# Patient Record
Sex: Male | Born: 1985 | Race: White | Hispanic: No | Marital: Married | Smoking: Never smoker
Health system: Southern US, Community
[De-identification: ages and names within clinical notes are randomized; demographics above are authoritative.]

## PROBLEM LIST (undated history)

## (undated) DIAGNOSIS — T7840XA Allergy, unspecified, initial encounter: Secondary | ICD-10-CM

## (undated) DIAGNOSIS — F419 Anxiety disorder, unspecified: Secondary | ICD-10-CM

## (undated) DIAGNOSIS — F509 Eating disorder, unspecified: Secondary | ICD-10-CM

## (undated) HISTORY — PX: HERNIA REPAIR: SHX51

## (undated) HISTORY — PX: ANTERIOR CRUCIATE LIGAMENT REPAIR: SHX115

## (undated) HISTORY — DX: Anxiety disorder, unspecified: F41.9

## (undated) HISTORY — PX: NASAL SEPTUM SURGERY: SHX37

## (undated) HISTORY — DX: Eating disorder, unspecified: F50.9

## (undated) HISTORY — DX: Allergy, unspecified, initial encounter: T78.40XA

---

## 2011-07-18 ENCOUNTER — Other Ambulatory Visit: Payer: Self-pay | Admitting: Occupational Medicine

## 2011-07-18 ENCOUNTER — Ambulatory Visit
Admission: RE | Admit: 2011-07-18 | Discharge: 2011-07-18 | Disposition: A | Discharge status: Home / Self Care | Payer: No Typology Code available for payment source | Source: Ambulatory Visit | Attending: Occupational Medicine | Admitting: Occupational Medicine

## 2011-07-18 DIAGNOSIS — Z021 Encounter for pre-employment examination: Secondary | ICD-10-CM

## 2012-10-12 ENCOUNTER — Ambulatory Visit (INDEPENDENT_AMBULATORY_CARE_PROVIDER_SITE_OTHER): Payer: 59 | Admitting: Licensed Clinical Social Worker

## 2012-10-12 DIAGNOSIS — F4322 Adjustment disorder with anxiety: Secondary | ICD-10-CM

## 2012-10-22 ENCOUNTER — Ambulatory Visit (INDEPENDENT_AMBULATORY_CARE_PROVIDER_SITE_OTHER): Payer: 59 | Admitting: Licensed Clinical Social Worker

## 2012-10-22 DIAGNOSIS — F4323 Adjustment disorder with mixed anxiety and depressed mood: Secondary | ICD-10-CM

## 2012-11-12 ENCOUNTER — Ambulatory Visit: Payer: Self-pay | Admitting: Licensed Clinical Social Worker

## 2012-11-26 ENCOUNTER — Ambulatory Visit: Payer: 59 | Admitting: Licensed Clinical Social Worker

## 2014-04-29 ENCOUNTER — Ambulatory Visit (INDEPENDENT_AMBULATORY_CARE_PROVIDER_SITE_OTHER): Payer: 59 | Admitting: Family Medicine

## 2014-04-29 VITALS — BP 110/64 | HR 85 | Temp 97.9°F | Resp 18 | Ht 70.0 in | Wt 176.0 lb

## 2014-04-29 DIAGNOSIS — R632 Polyphagia: Secondary | ICD-10-CM

## 2014-04-29 DIAGNOSIS — F502 Bulimia nervosa: Secondary | ICD-10-CM | POA: Diagnosis not present

## 2014-04-29 DIAGNOSIS — F509 Eating disorder, unspecified: Secondary | ICD-10-CM

## 2014-04-29 DIAGNOSIS — F430 Acute stress reaction: Secondary | ICD-10-CM | POA: Diagnosis not present

## 2014-04-29 LAB — POCT CBC
GRANULOCYTE PERCENT: 62.9 % (ref 37–80)
HEMATOCRIT: 47.7 % (ref 43.5–53.7)
HEMOGLOBIN: 16.3 g/dL (ref 14.1–18.1)
LYMPH, POC: 2.4 (ref 0.6–3.4)
MCH, POC: 30.2 pg (ref 27–31.2)
MCHC: 34.2 g/dL (ref 31.8–35.4)
MCV: 88.2 fL (ref 80–97)
MID (cbc): 0.7 (ref 0–0.9)
MPV: 7.9 fL (ref 0–99.8)
POC GRANULOCYTE: 5.2 (ref 2–6.9)
POC LYMPH PERCENT: 29.2 %L (ref 10–50)
POC MID %: 7.9 %M (ref 0–12)
Platelet Count, POC: 266 10*3/uL (ref 142–424)
RBC: 5.41 M/uL (ref 4.69–6.13)
RDW, POC: 12.7 %
WBC: 8.3 10*3/uL (ref 4.6–10.2)

## 2014-04-29 MED ORDER — ESCITALOPRAM OXALATE 10 MG PO TABS: 10.0000 mg | ORAL_TABLET | Freq: Every day | ORAL | Status: DC
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
## 2014-04-30 LAB — COMPLETE METABOLIC PANEL WITH GFR
ALT: 9 U/L (ref 0–53)
AST: 14 U/L (ref 0–37)
Albumin: 4.5 g/dL (ref 3.5–5.2)
Alkaline Phosphatase: 58 U/L (ref 39–117)
BILIRUBIN TOTAL: 1.7 mg/dL — AB (ref 0.2–1.2)
BUN: 15 mg/dL (ref 6–23)
CALCIUM: 9.7 mg/dL (ref 8.4–10.5)
CHLORIDE: 96 meq/L (ref 96–112)
CO2: 23 meq/L (ref 19–32)
CREATININE: 0.84 mg/dL (ref 0.50–1.35)
GFR, Est African American: >89 mL/min
GFR, Est Non African American: >89 mL/min
GLUCOSE: 59 mg/dL — AB (ref 70–99)
Potassium: 3.8 mEq/L (ref 3.5–5.3)
Sodium: 140 mEq/L (ref 135–145)
Total Protein: 7.3 g/dL (ref 6.0–8.3)

## 2014-04-30 LAB — TSH: TSH: 1.263 u[IU]/mL (ref 0.350–4.500)

## 2014-05-13 ENCOUNTER — Encounter: Payer: Self-pay | Admitting: *Deleted

## 2014-05-13 ENCOUNTER — Encounter: Payer: 59 | Attending: Physician Assistant | Admitting: *Deleted

## 2014-05-13 VITALS — Ht 70.0 in

## 2014-05-13 DIAGNOSIS — Z713 Dietary counseling and surveillance: Secondary | ICD-10-CM | POA: Insufficient documentation

## 2014-05-13 DIAGNOSIS — F509 Eating disorder, unspecified: Secondary | ICD-10-CM | POA: Insufficient documentation

## 2014-05-13 NOTE — Progress Notes (Signed)
Appointment start time: 0800  Appointment end time: 0900  Patient was seen on 05/13/14 for nutrition counseling pertaining to disordered eating  Primary care provider: Ricka BurdockSarah Webber, PA Therapist: Mike CrazeKarla Townsend Any other medical team members: none  Assessment: this is Derek Sanchez's initial nutrition visit.  "had bad tendencies for a long time... Like bulimia."  Binge purge and restricting.  Tried to see a nutritionist before, but insurance didn't  cover it.  Growing up mom fed kids all the time and he doesn't know proper nutirtion.  He and his wife are vegetarian.  Wife is a PT at [employer].  He doesn't do that as a restriction, but because of ethical reasons. Does eat eggs and dairy.  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  He is also in school full time and is set to graduate this fall.  4 days on and 4 days off.   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  Weight restoration not necessary.  However, once restricting eating and purging behavior is corrected, he most likely will gain some weight  Eating history: Length of time: 10 years Previous treatments: in the military saw pyschiatrist who prescribed klonopin and he didn't like that and quickly discontinued that medication.  Saw psychologist every 3 months and that wasn't often enough to be helpful.  CBT class in [employer] and  that helped him communicate.  Has been working with Paula ComptonKarla for 3 weeks.   Goals for RD meetings: understand what poper meal is consisted of.  Has "all or nothing" thinking.  Would like balance.  Wants to eat without worry about weight gain   Weight history:  Highest weight: 215 lb    Lowest weight: 160 lb Most consistent weight: 170-175 lb  What would you like to weigh:165-170 lb How has weight changed in the past year:  always fluctuates about 10 pounds  Medical Information: Changes in hair, skin, nails since ED started: none Chewing/swallowing difficulties: none Relux or heartburn: heartburn.  Had it all the time for about 2 years.  Is on omeprazole and that helps.  Used to chew gum but stopped and stopped OJ and tomato and that helps Trouble with teeth: none.  Seen dentist Constipation, diarrhea: none.  None normal BM Dizziness when he doesn't drink enough water.  He realizes that is an issue.  Drinks more when he's at work.   Sleeps better when he eats more  Mental health diagnosis: hasn't been diagnosed with an ED, but has been diagnosed with anxiety.  Meet criteria for bulimia nervosa   Dietary assessment: A typical day consists of 3-4 meals and 2-3 snacks  Safe foods include: fruits, vegetables (likes salad) Avoided foods include:candy, sweets, fried foods, diet sodas make him feel guilty, bread, starches,   Lacto-ovo vegetarian most of the time  24 hour recall:  Day off B: 3 eggs on 1-2 slice toast S: apple or raw veggies (carrots or celery) L: blue apron meal S: fruit or veggies or protein bar from [employer] D: another blue apron meal (might eat later) S: popcorn cooked in coconut oil.  Wife makes her own ice pops from blended fruits  Work days Wake up at 2 pm Eats something, but not a meal (maybe a bar or handful cashews)  Has shake made from almond milk.  Not sure why he switched While at work has bag of chips or swings by 24 hour [employer] to get cesar salad.  Or chicken wrap; Sometimes has meat or fish Get baked goods from coworkers Sometimes eats when he gets off, but feels guilty about it.  Might have bagel   What Methods Do You Use To Control Your Weight (Compensatory behaviors)?           Restricting (calories, fat, carbs): has FitBit and was counting calories to stay 1500-2000  SIV: daily up to 2-3 times  Diet pills: denies  Laxatives: in the past, and on occasion  Diuretics:  denies  Alcohol or drugs: once a week  Exercise (what type):  Runs in morning and lifts in afternoon 3-5 days.  Tries to be active daily (walking dog).  Gets out of his car every hour to walk around  Food rules or rituals (explain)  Binge: 5 days/week  Estimated energy intake: ~1700-1800 kcal outside of binge  Estimated energy needs: 3200 kcal 400 g CHO 160 g pro 107 g fat  Nutrition Diagnosis: NI-1.4 Inadequate energy intake As related to calorie restriction and high activity level.  As evidenced by eating disorder.  Intervention/Goals: Briefly discussed 2 reasons why people binge: emotional and physiological.  Physiologically he is not getting enough energy on his day-to -day intake and subsequent binges occur to ensure survival.  He is deficient in all major macronutrients.  Recommended 2 slices toast instead of 1 and switching back to cow's milk instead of almond   Monitoring and Evaluation: Patient will follow up in 2-3 weeks.

## 2014-05-17 ENCOUNTER — Ambulatory Visit (INDEPENDENT_AMBULATORY_CARE_PROVIDER_SITE_OTHER): Payer: 59 | Admitting: Physician Assistant

## 2014-05-17 ENCOUNTER — Emergency Department (HOSPITAL_COMMUNITY)
Admission: EM | Admit: 2014-05-17 | Discharge: 2014-05-17 | Disposition: A | Discharge status: Home / Self Care | Payer: Worker's Compensation | Attending: Emergency Medicine | Admitting: Emergency Medicine

## 2014-05-17 ENCOUNTER — Encounter (HOSPITAL_COMMUNITY): Payer: Self-pay | Admitting: *Deleted

## 2014-05-17 ENCOUNTER — Emergency Department (HOSPITAL_COMMUNITY): Payer: Worker's Compensation

## 2014-05-17 VITALS — BP 120/72 | HR 65 | Temp 97.4°F | Resp 16 | Ht 69.5 in | Wt 184.4 lb

## 2014-05-17 DIAGNOSIS — Y998 Other external cause status: Secondary | ICD-10-CM | POA: Diagnosis not present

## 2014-05-17 DIAGNOSIS — Y92410 Unspecified street and highway as the place of occurrence of the external cause: Secondary | ICD-10-CM | POA: Diagnosis not present

## 2014-05-17 DIAGNOSIS — F419 Anxiety disorder, unspecified: Secondary | ICD-10-CM | POA: Diagnosis not present

## 2014-05-17 DIAGNOSIS — S79912A Unspecified injury of left hip, initial encounter: Secondary | ICD-10-CM | POA: Insufficient documentation

## 2014-05-17 DIAGNOSIS — F509 Eating disorder, unspecified: Secondary | ICD-10-CM | POA: Diagnosis not present

## 2014-05-17 DIAGNOSIS — S4992XA Unspecified injury of left shoulder and upper arm, initial encounter: Secondary | ICD-10-CM | POA: Insufficient documentation

## 2014-05-17 DIAGNOSIS — Y9389 Activity, other specified: Secondary | ICD-10-CM | POA: Diagnosis not present

## 2014-05-17 DIAGNOSIS — S43102A Unspecified dislocation of left acromioclavicular joint, initial encounter: Secondary | ICD-10-CM

## 2014-05-17 DIAGNOSIS — Z79899 Other long term (current) drug therapy: Secondary | ICD-10-CM | POA: Diagnosis not present

## 2014-05-17 DIAGNOSIS — F430 Acute stress reaction: Secondary | ICD-10-CM

## 2014-05-17 MED ORDER — IBUPROFEN 800 MG PO TABS
800.0000 mg | ORAL_TABLET | Freq: Once | ORAL | Status: AC
  Administered 2014-05-17: 800 mg via ORAL
  Filled 2014-05-17: qty 1

## 2014-05-17 NOTE — ED Notes (Signed)
Upon entering room patient sitting in chair, writing, c/o "popping" in left shoulder. Informed patient provider will update him on radiology findings and if he would keep left shoulder stable.
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
## 2014-05-18 DIAGNOSIS — F509 Eating disorder, unspecified: Secondary | ICD-10-CM | POA: Insufficient documentation

## 2014-05-18 DIAGNOSIS — F430 Acute stress reaction: Secondary | ICD-10-CM | POA: Insufficient documentation

## 2014-05-18 NOTE — Progress Notes (Signed)
   Subjective:    Patient ID: Derek Sanchez S Berkowitz, male    DOB: 04/30/1985, 29 y.o.   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  Now he is working on getting out of the [employer] and Companymilitary possibly.  He is doing well on the medications.  He is tolerating them ok but he does not feel anything yet.  Review of Systems     Objective:   Physical Exam  Constitutional: He is oriented to person, place, and time. He appears well-developed and well-nourished.  BP 120/72 mmHg  Pulse 65  Temp(Src) 97.4 F (36.3 C) (Oral)  Resp 16  Ht 5' 9.5" (1.765 m)  Wt 184 lb 6 oz (83.632 kg)  BMI 26.85 kg/m2  SpO2 98%   HENT:  Head: Normocephalic and atraumatic.  Right Ear: External ear normal.  Left Ear: External ear normal.  Pulmonary/Chest: Effort normal.  Neurological: He is alert and oriented to person, place, and time.  Skin: Skin is warm and dry.  Psychiatric: He has a normal mood and affect. His behavior is normal. Judgment and thought content normal.       Assessment & Plan:  Disordered eating  Stress reaction  Formed filled out.  He will continue his medication and fu with me in 2-3 weeks.  He will continue therapy and nutrition which he has found very helpful.  Benny LennertSarah Ally Knodel PA-C  [facility] 05/18/2014 12:19 PM

## 2014-06-01 ENCOUNTER — Telehealth: Payer: Self-pay | Admitting: Radiology

## 2014-06-01 ENCOUNTER — Ambulatory Visit (INDEPENDENT_AMBULATORY_CARE_PROVIDER_SITE_OTHER): Payer: 59 | Admitting: Physician Assistant

## 2014-06-01 ENCOUNTER — Encounter: Payer: Self-pay | Admitting: Physician Assistant

## 2014-06-01 VITALS — BP 107/71 | HR 69 | Temp 98.3°F | Resp 16 | Ht 70.0 in | Wt 185.0 lb

## 2014-06-01 DIAGNOSIS — F509 Eating disorder, unspecified: Secondary | ICD-10-CM

## 2014-06-01 DIAGNOSIS — G4726 Circadian rhythm sleep disorder, shift work type: Secondary | ICD-10-CM

## 2014-06-01 MED ORDER — ESCITALOPRAM OXALATE 10 MG PO TABS: 10.0000 mg | ORAL_TABLET | Freq: Every day | ORAL | Status: DC

## 2014-06-01 MED ORDER — ZOLPIDEM TARTRATE 5 MG PO TABS: 5.0000 mg | ORAL_TABLET | Freq: Every evening | ORAL | Status: DC | PRN

## 2014-06-01 NOTE — Patient Instructions (Signed)
Trial of ambien to help with insomnia

## 2014-06-01 NOTE — Progress Notes (Signed)
   Subjective:    Patient ID: Derek Sanchez, male    DOB: 11/18/1985, 29 y.o.   MRN: [id number]  HPI  Pt presents to clinic for a recheck.  He has continued with therapy and he feels like it is helping.  He is having less problems with binging and purging.  Over the weekend he had no episodes.  He has found the nutritional therapy to be helpful.  He has had slight increase in anxiety resulting in decreased sleep due to a work issue.  He feels like it will get better as time goes on.  He does find that his shift work is not helping with his sleep issue - he works 4 nights and then had 4 days off.  He sleeps well when he is on his nights but when he is off during the day he cannot get to sleep.  He has tried melatonin but he is to groggy the next day to want to use that treatment for his insomnia. He also finds that his eating is off because when he is at work he eats"lunch" at midnight and then on his off days when he is sleeping he finds that he wakes up about the same time and feels like he needs to eat.  He is not sure if he is hunger or not - but after he eats he feels guilty because he has already eaten a full dinner before bed.   Derek Sanchez - therapy  Derek Sanchez - nutrition  Review of Systems  Psychiatric/Behavioral: Positive for sleep disturbance. Negative for dysphoric mood. The patient is nervous/anxious.        Objective:   Physical Exam  Constitutional: He is oriented to person, place, and time. He appears well-developed and well-nourished.  BP 107/71 mmHg  Pulse 69  Temp(Src) 98.3 F (36.8 C)  Resp 16  Ht 5\' 10"  (1.778 m)  Wt 185 lb (83.915 kg)  BMI 26.54 kg/m2  SpO2 96%   HENT:  Head: Normocephalic and atraumatic.  Right Ear: External ear normal.  Left Ear: External ear normal.  Eyes: Conjunctivae are normal.  Neck: Normal range of motion.  Pulmonary/Chest: Effort normal.  Neurological: He is alert and oriented to person, place, and time.  Skin: Skin is warm and dry.    Psychiatric: He has a normal mood and affect. His behavior is normal. Judgment and thought content normal.        Assessment & Plan:  Disordered eating - Plan: escitalopram (LEXAPRO) 10 MG tablet  Sleep disorder, circadian, shift work type - Plan: zolpidem (AMBIEN) 5 MG tablet  Continue therapy. Continue Lexapro - he does feel like it is helping his thought process around food.  We are going to try Ambien on the nights that he does not work.  He is hoping that the 1st night is all he needs to reset his sleep hours.  He has tried melatonin but it makes him groggy the next day.  Benny LennertSarah Weber PA-C  [facility] 06/01/2014 6:06 PM

## 2014-06-01 NOTE — Telephone Encounter (Signed)
ambien called in  

## 2014-06-03 ENCOUNTER — Encounter: Payer: 59 | Admitting: *Deleted

## 2014-06-03 DIAGNOSIS — F509 Eating disorder, unspecified: Secondary | ICD-10-CM | POA: Diagnosis not present

## 2014-06-03 NOTE — Progress Notes (Signed)
Appointment start time: 0800  Appointment end time: 0900  Patient was seen on 05/2714 for nutrition counseling pertaining to disordered eating  Primary care provider: Ricka BurdockSarah Webber, PA Therapist: Mike CrazeKarla Townsend Any other medical team members: none  Assessment: Things are going well.  He liked having an assignment last visit.  He started drinking 2% milk and feels more energetic.  Sometimes wakes up in the middle of the night and drinks milk.  Started drinking chocolate milk as recovery beverage after workout.  Also started having 2 slices toast with breakfast more often.    Was prescribed ambien and will take it the first night of his days off shift.  He has decreased his purging behaviors to once daily and 2 days he didn't purge at all!  Administered EAT-26 Score significant >20 Patient score on 06/03/14: 19   HPI: "had bad tendencies for a long time... Like bulimia."  Binge purge and restricting.  Tried to see a nutritionist before, but insurance didn't  cover it.  Growing up mom fed kids all the time and he doesn't know proper nutirtion.  He and his wife are vegetarian.  Wife is a PT at [employer].  He doesn't do that as a restriction, but because of ethical reasons. Does eat eggs and dairy.  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  He is also in school full time and is set to graduate this fall.  4 days on and 4 days off.   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  Weight restoration not necessary.  However, once restricting eating and purging behavior is corrected, he most likely will gain some weight  Eating history: Length of time: 10 years Previous treatments: in the military saw pyschiatrist who prescribed klonopin and he didn't like that and quickly discontinued that medication.  Saw  psychologist every 3 months and that wasn't often enough to be helpful.  CBT class in [employer] and  that helped him communicate.  Has been working with Paula ComptonKarla for 3 weeks.   Goals for RD meetings: understand what poper meal is consisted of.  Has "all or nothing" thinking.  Would like balance.  Wants to eat without worry about weight gain   Weight history:  Highest weight: 215 lb    Lowest weight: 160 lb Most consistent weight: 170-175 lb  What would you like to weigh:165-170 lb How has weight changed in the past year: always fluctuates about 10 pounds  Medical Information: Changes in hair, skin, nails since ED started: none Chewing/swallowing difficulties: none Relux or heartburn: heartburn.  Had it all the time for about 2 years.  Is on omeprazole and that helps.  Used to chew gum but stopped and stopped OJ and tomato and that helps Trouble with teeth: none.  Seen dentist Constipation, diarrhea: none.  None normal BM Dizziness when he doesn't drink enough water.  He realizes that is an issue.  Drinks more when he's at work.   Sleeps better when he eats more  Mental health diagnosis: hasn't been diagnosed with an ED, but has been diagnosed with anxiety.  Meet criteria for bulimia nervosa   Dietary assessment: A typical day consists of 3-4 meals and 2-3 snacks  Safe foods include: fruits, vegetables (likes salad) Avoided foods  include:candy, sweets, fried foods, diet sodas make him feel guilty, bread, starches,   Lacto-ovo vegetarian most of the time  24 hour recall: Days off B: hard boiled eggs (2-3) 1-2 slices toast.  Glass water L: precooked meal or frozen burrito Tends to graze on and feels guity.  Eats chips, popcon, dried coconut flakes, fit an active bars, trail mix (soemtimes eats several) D: another prepared meal S: popcorn or watermelon Beverages: water or [employer] Miss breakfast most days.  Used to eat before going to bed Grazes during the day while doing  homework D: meal that he brings to work or gets something out Snacks during shift on cheezits, pistachios, sometimes candy bar or candy corn Beverages: drinks more water or diet soda    What Methods Do You Use To Control Your Weight (Compensatory behaviors)?           Restricting (calories, fat, carbs): has FitBit and was counting calories to stay 1500-2000  SIV: 1/day.  That has decreased in the past several weeks!!  Diet pills: denies  Laxatives: in the past, and on occasion  Diuretics: denies  Alcohol or drugs: once a week  Exercise (what type):  Runs in morning and lifts in afternoon 3-5 days.  Tries to be active daily (walking dog).  Gets out of his car every hour to walk around  Food rules or rituals (explain)  Binge: 5 days/week  Estimated energy intake: ~1800 kcal outside of binge  Estimated energy needs: 3200 kcal 400 g CHO 160 g pro 107 g fat  Nutrition Diagnosis: NI-1.4 Inadequate energy intake As related to calorie restriction and high activity level.  As evidenced by eating disorder.  Intervention/Goals:  Debunked diet/eating disorder myths.  Discussed hunger/fullness cues.  Alp waits until he's starving and then overeats, binges and then feels guilty and compensates.  On 2 days when he ate balanced meals, he didn't purge at all!  When he gets adequate nourishment earlier in the day (breakfast and lunch) he won't be so hungry later on and won't be as tempted to binge.  Discussed MyPlate recommendations for meal planning: CARBS, protein, dairy, fruits, and veggies at most meals.  Discussed calorie needs.  He looked up online he needs 3200-3500 and he's pretty aware he gets less than 2000.  Discussed metabolic effects of severe energy restriction and challenged him to move closer to meeting his body's needs in time.  Discussed the potential for his FitBit to be triggering.    3 eggs, 2 slices toast with fruit or milk or smoothie for breakfast Consider what your exercise  is doing to your malnourished body (breaking down muscle tissue instead of building it up) Remember that SIV does not effectively purge your body of calories, you've already absorbed them Most of your eating disorder behaviors harm your body.  Some of them (calorie restriction and excessive exercise) induce survival mechanisms and decrease your metabolic rate Try to wait 20 minutes after eating before engaging in compensatory behaviors; it takes 20 minutes for food to settle, so most likely the urge to purge will fade with time  Monitoring and Evaluation: Patient will follow up in 2-3 weeks.

## 2014-06-22 ENCOUNTER — Encounter: Payer: Self-pay | Admitting: *Deleted

## 2014-06-22 ENCOUNTER — Encounter: Payer: 59 | Attending: Physician Assistant | Admitting: *Deleted

## 2014-06-22 DIAGNOSIS — F509 Eating disorder, unspecified: Secondary | ICD-10-CM | POA: Insufficient documentation

## 2014-06-22 DIAGNOSIS — Z713 Dietary counseling and surveillance: Secondary | ICD-10-CM | POA: Insufficient documentation

## 2014-06-22 NOTE — Patient Instructions (Signed)
Honor hunger and fullness cues, eat around level 3 and stop around level 7 don't go too long without eating and make sure you eat enough during the day Eat without distractions: no tv or school work, or work work Actor for 20 minutes.  Take smaller bites, chew thoroughly, put your fork down.  Pause and assess your fullness It's ok to eat second portions, or 3rd portions if you're hungry.  Give yourself permission to honor your hunger!

## 2014-06-22 NOTE — Progress Notes (Signed)
Appointment start time: 0800  Appointment end time: 0900  Patient was seen on 06/22/14 for nutrition counseling pertaining to disordered eating  Primary care provider: Ricka Burdock, PA Therapist: [name] Any other medical team members: none  Assessment: Things are going well.  He's been working on school and thinking about getting his Masters degree. Whenever he finishes with [employer] duty, he will apply for the [employer].  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  He hasn't been told yet.  He is experiencing shoulder pain and is thinking about asking his PCP for a medical recommendation he get fitted for a new vest at work.  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     The frequency of his binging and purgina has decreased.  He is concerned about how much he is eating at meals.  He's been trying to follow MyPlate and then he gets second portions.  He would like to wait 20-30 minutes to see if he is still hungry.  In the past 2 weeks he has binged 4-6 times and purged 1-2, maybe 3 times.  He is tired of the eating disorder cycle.  He has gained a little weight and he's concerned about that, but he's dealing with it.  He's not as concerned as he would have been in the past.  He passed his [employer] physical exam and is glad that's behind him.      Reports less anxiety in general.  He is accepting things and letting things go.  He is not as anxious about his weight as before, or not exercising.  He's progressing really nicely   Mental health diagnosis: Meets criteria for bulimia nervosa   Dietary assessment: A typical day consists of 3-4 meals and 2-3 snacks  Safe foods include: fruits, vegetables (likes salad) Avoided foods include:candy, sweets, fried foods, diet sodas make him feel guilty, bread, starches,   Lacto-ovo vegetarian most of the time  24 hour recall Got off at 7am At midnight had salad with beans and corn At 7 am: 1/2  apple bearclaw Didn't feel like he needed anything else to eat. Went to bed Woke up around 1, did some homework and ate kashi bar Ran errands 3-4 pm ate 12 in [place] sub and bag chips Dinner: toast with butter and jelly  Over the weekend went to [place] and had kebabs with veggies and tofu and those were really yummy.  Had 2 skewers, rice, and cabbage slaw with raisins.  Felt that was too much.  Got 2 more skewers.  Wasn't sure if he was actually hungry That day for breakfast had 2 hard boiled eggs with fruit Then around 10-11 had smoothie: fruit and vegetables with chia seeds Lunch was more grazing: chips, nuts Realized he didn't eat enough and that's why he ate more at night  Exercise: not as much.  Has been really busy with school work  What Methods Do You Use To Control Your Weight (Compensatory behaviors)?           Restricting (calories, fat, carbs): has FitBit and was counting calories to stay 1500-2000  SIV: 1-3 times in 2 week.  That has decreased in the past several weeks!!  Diet pills: denies  Laxatives: in the past, and on occasion  Diuretics: denies  Alcohol or drugs: once a week  Exercise (what type):  Not much right now  Food rules or rituals (explain)  Binge: 5 days/2 weeks.  This has also significant decreased!!  Estimated energy intake: ~1800 kcal outside of binge  Estimated energy needs: 3200 kcal 400 g CHO 160 g pro 107 g fat  Nutrition Diagnosis: NI-1.4 Inadequate energy intake As related to calorie restriction and high activity level.  As evidenced by eating disorder.  Intervention/Goals:  Discussed honoring hunger and fullness cues.  Gave card describing levels of hunger/fullness that he found very helpful.  Encouraged Jrake to eat when he's somewhat hungry, not starving, and to eat until he is comfortably satisfied.  He realizes that he's not eating enough during the day which is why he's eating more in the evenings.  Gave permission to honor hunger, sometimes  he will be more hungry and will need to eat more and that is ok.  Encouraged more mindful eating: eating without distractions and eating more slowly; aim to make meal last 20 minutes Discussed ineffectiveness of weight control methods: extreme exercise, SIV, restriction, as his body will fight back and alter metabolic functioning in order to preserve mass.  He is starting to realize these things and is sick of his eating disorder thoughts and really looking forward to recovery.      Monitoring and Evaluation: Patient will follow up in 2 weeks.

## 2014-07-06 ENCOUNTER — Encounter: Payer: 59 | Admitting: *Deleted

## 2014-07-06 ENCOUNTER — Encounter: Payer: Self-pay | Admitting: *Deleted

## 2014-07-06 DIAGNOSIS — F509 Eating disorder, unspecified: Secondary | ICD-10-CM | POA: Diagnosis not present

## 2014-07-06 NOTE — Patient Instructions (Signed)
Breakfast: couple eggs with toast Bagel with fruit English muffin with hummus and boiled eggs Oatmeal with peanut butter and banana Yogurt with granola and dried fruit (parfait)  Lunches: Frozen meals with steamed veggies Veggie burger with fruit PB and J with yogurt or fruit or chips Another type of sandwich: [place] Tuna salad or salmon or chicken salad  Snacks: Trail mix Fruit hummus with carrots Bars, or Belvita Popcorn

## 2014-07-06 NOTE — Progress Notes (Signed)
Appointment start time: 0800  Appointment end time: 0900  Patient was seen on 07/06/14 for nutrition counseling pertaining to disordered eating  Primary care provider: Ricka BurdockSarah Webber, PA Therapist: Mike CrazeKarla Townsend Any other medical team members: none  Assessment[place].  Is looking forward to relaxing.   This week he reports his eating has not been going as well as he hoped.  He went to a wedding and he did well there: he felt like he portioned his plate well, he ate slowly and didn't get seconds. Is moving to [place].  He still hasn't heard back from [place] [employer] if he's being [place].  It's possible that this unknown is stressing him out and affecting his eating.    He hasn't had the motivation to work out.  It's been weeks.  He knows he's too busy, so it doesn't bother him as much, but on some level he feels like he's gaining weight.  He thinks his pants are getting tighter.  He knows that this is part of recovery.  He isn't sure what his appropriate set point is.  His dad's side of the family is thinner and he thinks he takes after his dad's side.  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   Most of his cooking supplies are boxed up and with the move, the [employer], his wife's grandmother (recently diagnosed with RA) and his wife (hands have really been bothering her)... He is under a lot of stress.  He had finals last week and so he has some time off until July 4 when summer school starts.  He is under a lot of stress.    A couple days he orders Cong[place] food: fried rice, chicken and soup. One day he had pizza.  Per conversation with Paula ComptonKarla last week, she revealed to this provider that Freida BusmanAllen revealed to her that he deliberately chooses these foods to binge on.  Per Dimas MillinKarla, Kermitt told her he  doesn't eat these foods in front of his wife and he chooses to get them in secret in order to binge.  [name] has not yet shared this information with this provider.  When probed today, [name] states he does eat differently when he's alone vs when he's with his wife.  That he's afraid she'll judge him.  He acknowledges she probably won't, but he does feel guilty about eating.   He realizes he could plan ahead, he just doesn't thik about it.  He gets "compulsive about it"  At work 1/2 sandwich [employer]otKettle chips Individual servings of nuts bring stuff from home     What Methods Do You Use To Control Your Weight (Compensatory behaviors)?           Restricting (calories, fat, carbs): has FitBit and was counting calories to stay 1500-2000  SIV: 1-3 times in 2 week.  That has decreased in the past several weeks!!  Diet pills: denies  Laxatives: in the past, and on occasion  Diuretics: denies  Alcohol or drugs: once a week  Exercise (what type):  Not much right now  Food rules or rituals (explain)  Binge: 5 days/2 weeks.  This has also significant decreased!!  Estimated energy intake: ~1800 kcal outside of binge  Estimated energy  needs: 3200 kcal 400 g CHO 160 g pro 107 g fat  Nutrition Diagnosis: NI-1.4 Inadequate energy intake As related to calorie restriction and high activity level.  As evidenced by eating disorder.  Intervention/Goals:  Whenever we eat in secret, those foods have a forbidden fruit aura and will always been heightened emotionally and be triggers.  It's important to bring those foods out of the "darkness" so to speak and to eat chinese food and pizza in front of others to reduce their emotional hold.  At some point, he will need to eat those things with his wife. Discussed ineffectiveness of exercise on weight loss as exercise induces hunger and builds muscle , causing increased weight.  It is possible to maintain weight without exercise.  Filimon acknowledges he is stronger and  faster now that he is better nourished and can appreciate how adequate nutrition and improved performance.   Discussed meals and snack he can make at home with minimal effort and importance of planning ahead.  He was able to identify several meal options. Discussed road to recovery is not linear, but there are multiple detours and that is ok.  He is making great progress and he's still very optimistic about his recovery. Challenged distorted beliefs about carbs (speciaificlaly bagel)   Monitoring and Evaluation: Patient will follow up in 2 weeks.

## 2014-07-20 ENCOUNTER — Encounter: Payer: 59 | Attending: Physician Assistant | Admitting: *Deleted

## 2014-07-20 DIAGNOSIS — Z713 Dietary counseling and surveillance: Secondary | ICD-10-CM | POA: Insufficient documentation

## 2014-07-20 DIAGNOSIS — F509 Eating disorder, unspecified: Secondary | ICD-10-CM | POA: Diagnosis not present

## 2014-07-20 NOTE — Progress Notes (Signed)
Appointment start time: 0800  Appointment end time: 0900  Patient was seen on 07/20/14 for nutrition counseling pertaining to disordered eating  Primary care provider: Ricka Burdock, PA Therapist: [name] Any other medical team members: none  Assessment:  Samael has had a busy couple of weeks. He and his wife went to [place] [facility] on vacation and that was enjoyable, but when they got back, they immediately moved to another apartment.  His wife's grandmother passed away and they went to [place] for the funeral.  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  His term will last 11 months.  He denies any concern or anxiety about this move and is actually looking forward to working "normal hours" and getting his sleep schedule back on track.  He is curious about treatment options while in [place].  This provider is available via phone or email for questions/concerns, but not available for distance counseling services.  Suggested getting a treatment team in [place].  He is considering looking into services [facility] the [facility].  He's been eating Dr. Erenest Blank book "Overcomming Binge Eating"   While in [place] he felt his eating went fine B:Eggs with toast and fruit; cereal;  L: salad with chickpeas and hummus; veggie burgers; fish tacos D: ordered [place] food and that went fine.  This was a food challenge for him and he did ok with it; he didn't eat much rice, but he did eat the egg roll, entre, and fortune cookie  Now that he's back; he feels "ok" about his eating The move was difficult on his eating because things weren't available; his cooking supplies were packed and he had to grab and go Someone brought them dinner and that was good; also Tofu on grill  When asked about binge episodes he responded: When he moved they ordered pizza for the movers and themselves.  He had 2 slices and then he waited and then he had another 2-3 slices throughout the day. HE had pizza for lunch, afternoon snack,  and night-time snack.  He feels that is too much and he does consider that a "binge".  He also ordered [place] for lunch, despite having food to fix at home.  He ate until stuffed and then purged.  He had 2 B/P episodes in 2 weeks.  This is a dramatic improvement from 1+ episodes/day  Upcomming meal options:  He feels prepared and set up for success over the next couple days B: eggs, banana, oatmeal, cereal, toast, bagel L: PB and J; tofurky; ready made meals (sweetpotato, black bean,spinach with toast); quinoa with vegetables S: tail mix, triscuits, yogurt, fruit, raw veggies with hummus    Physical activity: elliptical on Monday for 30 minutes and some ab exercise; yesterday rode stationary bike for 30 minutes Plans is to exercise no less than 3, no more than 5 days/week     What Methods Do You Use To Control Your Weight (Compensatory behaviors)?           Restricting (calories, fat, carbs): has FitBit and was counting calories to stay 1500-2000  SIV: 2 times in 2 week.  That has decreased in the past several weeks!!  Diet pills: denies  Laxatives: in the past, and on occasion  Diuretics: denies  Alcohol or drugs: beer once a week  Exercise (what type):  Just started back  Food rules or rituals (explain)  Binge: no true binge in 2 weeks.  Felt he ate excessively twice, but  not true binge   Estimated energy intake: ~2000-2500 kcal   Estimated energy needs: 2900-3200 kcal 400 g CHO 160 g pro 107 g fat  Nutrition Diagnosis: NI-1.4 Inadequate energy intake As related to calorie restriction and high activity level.  As evidenced by eating disorder.  Intervention/Goals:  Discussed definition of "true binge" and how his disordered food behaviors have changed.  Suggested self-care in ways of rewarding his progress.  He has never treated/rewarded himself and is unsure how to go about that.  He plans to Google something.  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  Discussed options  for meals and snacks when he's at home. Discussed exercise recommendations.   Monitoring and Evaluation: Patient will follow up in 2 weeks.

## 2014-08-02 ENCOUNTER — Ambulatory Visit: Payer: Self-pay | Admitting: *Deleted

## 2014-08-02 ENCOUNTER — Other Ambulatory Visit: Payer: Self-pay | Admitting: Physician Assistant

## 2014-08-08 ENCOUNTER — Encounter: Payer: 59 | Attending: Physician Assistant | Admitting: *Deleted

## 2014-08-08 ENCOUNTER — Encounter: Payer: Self-pay | Admitting: *Deleted

## 2014-08-08 DIAGNOSIS — Z713 Dietary counseling and surveillance: Secondary | ICD-10-CM | POA: Diagnosis not present

## 2014-08-08 DIAGNOSIS — F509 Eating disorder, unspecified: Secondary | ICD-10-CM | POA: Diagnosis not present

## 2014-08-08 NOTE — Progress Notes (Signed)
Appointment start time: 1000  Appointment end time: 16109  Patient was seen on 08/08/14 for nutrition counseling pertaining to disordered eating  Primary care provider: Ricka Burdock, PA Therapist: [name] Any other medical team members: none  Assessment:  Is reading Dr. Erenest Blank book on overcoming binge eating Heart burn has been the worst it's been in 3 years.  Started something OTC, but wants prescription for ompeprazole.  Tried almond milk and that is helping a lot.   Has been drinking more and wonders if that is triggering his reflux?.  Wife questions if his anxiety could be triggering his reflux?  He isn't sure if that's it.  This provider is inclined to agree it could be stress//anxiety related as his diet hasn't really changed.   Has ben proud of himself with his eating: Bowl cheerios for breakfast, switched back to almond milk. Had banana in his cereal; 2 slices toast, 1 egg S: slice toast with butter product L: salmon with salad with vegetables, chickpeas and balsamic dressing; salad with 1 whole avocado, cucumbers and tomatoes etc S: apples or peach; trail mix D: went out to dinner at [employer] (coconut shrimp with rice) saved leftovers; leftovers with avocado and vegetable lasagna  Has been sleeping through the night and not waking up hungry Physical activity: went to gym a couple days last week; maybe will go a couple times this week Likes routine and hopes to have more routine schedule   Treatment plan While in [place]: [facility]  May not have ED specialists    Estimated energy intake: ~2000-2500 kcal   Estimated energy needs: 3000 kcal 375 g CHO 150 g pro 100 g fat  Nutrition Diagnosis: NI-1.4 Inadequate energy intake As related to calorie restriction and high activity level.  As evidenced by eating disorder.  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 Discussed ways to ensure success: plan ahead; have food  available in home as to avoid panic, there's nothing to eat, order in binge food.  Discussed "all or nothing" mentality and how to live in the middle area.  Discussed men in the media and how that image is not real (photshop) and how the muscular physique is not attainable.  Suggested watching [employer] video on men in the media   Monitoring and Evaluation: Patient will follow up in 1 weeks.

## 2014-08-11 ENCOUNTER — Ambulatory Visit: Payer: Self-pay | Admitting: *Deleted

## 2014-08-17 ENCOUNTER — Ambulatory Visit (INDEPENDENT_AMBULATORY_CARE_PROVIDER_SITE_OTHER): Payer: 59 | Admitting: Physician Assistant

## 2014-08-17 VITALS — BP 118/62 | HR 82 | Temp 98.7°F | Resp 14 | Ht 70.0 in | Wt 204.0 lb

## 2014-08-17 DIAGNOSIS — K219 Gastro-esophageal reflux disease without esophagitis: Secondary | ICD-10-CM | POA: Diagnosis not present

## 2014-08-17 DIAGNOSIS — F509 Eating disorder, unspecified: Secondary | ICD-10-CM

## 2014-08-17 MED ORDER — ESCITALOPRAM OXALATE 10 MG PO TABS: ORAL_TABLET | ORAL | Status: DC

## 2014-08-17 MED ORDER — OMEPRAZOLE 20 MG PO CPDR: 20.0000 mg | DELAYED_RELEASE_CAPSULE | Freq: Every day | ORAL | Status: AC

## 2014-08-17 NOTE — Patient Instructions (Signed)
Recheck in 8 weeks -  Contact me through [employer] - if you have problems

## 2014-08-17 NOTE — Progress Notes (Signed)
   Derek Sanchez  MRN: [id number] DOB: 06-Mar-1985  Subjective:  Pt presents to clinic for a recheck.  He feels like he is doing really well on the Lexapro and he likes the decrease in anxiety.  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  During this summer he has been working nights and he has not been exercising like he knows he needs to.  Patient Active Problem List   Diagnosis Date Noted  . GERD (gastroesophageal reflux disease) 08/17/2014  . Disordered eating 05/18/2014  . Stress reaction 05/18/2014    Current Outpatient Prescriptions on File Prior to Visit  Medication Sig Dispense Refill  . Multiple Vitamin (MULTIVITAMIN) capsule Take 1 capsule by mouth daily.    . Omega-3 300 MG CAPS Take 900 mg by mouth daily.    Marland Kitchen zolpidem (AMBIEN) 5 MG tablet Take 1 tablet (5 mg total) by mouth at bedtime as needed for sleep. 20 tablet 0   No current facility-administered medications on file prior to visit.    Allergies  Allergen Reactions  . Other Itching    Apples: red delicious only    Review of Systems Objective:  BP 118/62 mmHg  Pulse 82  Temp(Src) 98.7 F (37.1 C) (Oral)  Resp 14  Ht  (1.778 m)  Wt 204 lb (92.534 kg)  BMI 29.27 kg/m2  SpO2 98%  Physical Exam  Constitutional: He is oriented to person, place, and time and well-developed, well-nourished, and in no distress.  HENT:  Head: Normocephalic and atraumatic.  Right Ear: External ear normal.  Left Ear: External ear normal.  Eyes: Conjunctivae are normal.  Neck: Normal range of motion.  Cardiovascular: Normal rate, regular rhythm and normal heart sounds.   No murmur heard. Pulmonary/Chest: Effort normal  and breath sounds normal. He has no wheezes.  Neurological: He is alert and oriented to person, place, and time. Gait normal.  Skin: Skin is warm and dry.  Psychiatric: Mood, memory, affect and judgment normal.    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  We discussed and he is agrees that we will continue to Lexapro for now and then recheck in 2 months to look at how his weight as changed.  If he is still gaining weight we may consider change to Wellbutrin.  Benny Lennert PA-C  [facility] 08/17/2014 4:30 PM

## 2014-08-18 ENCOUNTER — Encounter: Payer: 59 | Admitting: *Deleted

## 2014-08-18 DIAGNOSIS — F509 Eating disorder, unspecified: Secondary | ICD-10-CM

## 2014-08-18 NOTE — Progress Notes (Signed)
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 He had a medical visit with his PCP yesterday and is distressed to learn he's gained ~20 pounds.  He believes his SSRI could have contributed to this, but is willing to wait a few more months before making medication changes.  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  He's concerned about life insurance policies, etc that are weight-dependent. He plans to get back into exercise (not excessively) when he's in [place].    He has not reached out to treatment providers in the area as he doesn't know his work schedule yet.  He will reach out to a local team when he knows more.    Will start eating meat when he's away Plans to exercse 1-1.5 hours 4-5 days/week.  Does not want to be excessive about it Will face time with wife for meal support Wife is going to send him recipes Isnt sure what to do with his fitbit .  Stopped logging meals.  Is considering wearing to to ensure he doesn't exercise too much, but understands that is a slippery slope and the device is triggering.  Is considering not using it  Reflux is much better; almond milk helps for acute attacks. Sleep is much better   24 hour recall B: eggs with toast with hot sauce; oatmeal with apple slices; cheerios with bananas or berries; mcdonald's (egg mcmuffin and hasbrown and coffee and 2% milk) L: tofurkey sandwich with veggies and pretzels; tuna or salmon on salad with beans and vegetables; chick-fil-a spicy sandwich with cheese and waffle fries S: sunflower seeds, Nabs, couple doughnut holes D: salad from salad bar at [employer] with  pasta salad, some sushi  Felt really good about his day that day.  Was tempted to binge or order poorer choices- is using his hunger/fullness scale.  Feels really good about knowing when to eat and how much to eat.  Appreciates the tools he's been given  Binged one day this last week:  Early morning appt.  Didn't eat breakfast.  Got out at noon Went to [employer]: 2 sandwiches (roast beef and cheese; brisket), cobb salad, 1 order of fries Purged afterwards Nothing else to eat until dinner: tofu, brussels, sweet potato fries Nothing afterward, felt bad about his weight  Knows he needs to plan ahead better   Estimated energy intake: ~2000-2500 kcal   Estimated energy needs: 2500-3000 kcal 375 g CHO 150 g pro 100 g fat  Nutrition Diagnosis: NI-1.4 Inadequate energy intake As related to calorie restriction and high activity level.  As evidenced by eating disorder.  Intervention/Goals:    Discussed HAES principles.  Discussed "appropriate weight" for him.  He was at "normal BMI" before, but was restricting and over-exercising to maintain that lower weight.  Now he's honoring his body better and has a higher weight.  Is it because of his SSRI? Or because he's honoring his body? Or both?  Doesn't matter.  To be thinner he had to do drastic things.  That was not healthy nor appropriate.  Talked about body distortion in the media and what is means to be a "normal" male.  Reinforced  positive messages.  Discussed "all or nothing" mentality and encouraged him to give himself permission to live in the gray:  If he eats more, don't need to compensate for it; it will be ok. Gave resources for body positivity    Monitoring and Evaluation: Patient will follow up prn

## 2014-08-23 ENCOUNTER — Encounter: Payer: Self-pay | Admitting: Physician Assistant

## 2014-08-23 ENCOUNTER — Telehealth: Payer: Self-pay

## 2014-08-23 NOTE — Telephone Encounter (Signed)
Pt states he is in [employer] and need a note from Maralyn Sago stating he is under her care, please call [phone]

## 2014-08-24 ENCOUNTER — Ambulatory Visit: Payer: 59 | Admitting: Physician Assistant

## 2014-08-24 ENCOUNTER — Telehealth: Payer: Self-pay

## 2014-08-24 ENCOUNTER — Encounter: Payer: Self-pay | Admitting: Physician Assistant

## 2014-08-24 NOTE — Telephone Encounter (Signed)
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 Can we please send this to his MyChart so that he can print it. Thanks so much

## 2014-08-24 NOTE — Telephone Encounter (Signed)
I wrote a letter but it is very basic if it needs to be more specific please let me know.

## 2014-08-24 NOTE — Telephone Encounter (Signed)
Pt notified that letter was written. Will call us back if there is any problems.

## 2014-08-24 NOTE — Telephone Encounter (Signed)
See other phone message  

## 2014-08-24 NOTE — Telephone Encounter (Signed)
Please print the most recent letter that I wrote the patiet (with his medication dosage) and use my stamp since I will be out of the office until next week.  Thanks

## 2014-08-24 NOTE — Telephone Encounter (Signed)
This really should be something that Maralyn Sago writes for the patient. Talked with Dr. Patsy Lager and we both agree that we would feel uncomfortable writing this letter since we do not know the patient.

## 2014-08-24 NOTE — Telephone Encounter (Signed)
Patient called inquiring about his previously sent e-mails to Benny Lennert.  He is in the [employer] in [place], [place] and is in need of a letter.   Please contact patient at [phone]

## 2015-08-29 ENCOUNTER — Encounter (HOSPITAL_COMMUNITY): Payer: Self-pay

## 2015-08-29 ENCOUNTER — Emergency Department (HOSPITAL_COMMUNITY)
Admission: EM | Admit: 2015-08-29 | Discharge: 2015-08-29 | Disposition: A | Discharge status: Home / Self Care | Payer: Worker's Compensation | Attending: Emergency Medicine | Admitting: Emergency Medicine

## 2015-08-29 ENCOUNTER — Emergency Department (HOSPITAL_COMMUNITY): Payer: Worker's Compensation

## 2015-08-29 DIAGNOSIS — Y999 Unspecified external cause status: Secondary | ICD-10-CM | POA: Diagnosis not present

## 2015-08-29 DIAGNOSIS — Y9355 Activity, bike riding: Secondary | ICD-10-CM | POA: Diagnosis not present

## 2015-08-29 DIAGNOSIS — M25561 Pain in right knee: Secondary | ICD-10-CM | POA: Diagnosis not present

## 2015-08-29 DIAGNOSIS — Y92410 Unspecified street and highway as the place of occurrence of the external cause: Secondary | ICD-10-CM | POA: Diagnosis not present

## 2015-08-29 DIAGNOSIS — R079 Chest pain, unspecified: Secondary | ICD-10-CM | POA: Insufficient documentation

## 2015-08-29 NOTE — ED Notes (Signed)
Pt stable, ambulatory, states understanding of discharge instructions 
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
## 2015-08-29 NOTE — ED Triage Notes (Signed)
EMS gave 100 mcg of fentanyl

## 2016-04-04 ENCOUNTER — Encounter: Payer: 59 | Attending: Family Medicine | Admitting: *Deleted

## 2016-04-04 ENCOUNTER — Encounter: Payer: Self-pay | Admitting: *Deleted

## 2016-04-04 DIAGNOSIS — F502 Bulimia nervosa: Secondary | ICD-10-CM | POA: Diagnosis not present

## 2016-04-04 DIAGNOSIS — F509 Eating disorder, unspecified: Secondary | ICD-10-CM

## 2016-04-04 DIAGNOSIS — Z713 Dietary counseling and surveillance: Secondary | ICD-10-CM | POA: Insufficient documentation
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
## 2016-04-04 NOTE — Patient Instructions (Signed)
Add carbs to breakfast: oatmeal with fruit Add avocado and or nuts to snacks Keep 3 meals and 2-3 snacks We need to feed your brain so that it is stronger

## 2016-04-17 ENCOUNTER — Encounter: Payer: 59 | Attending: Family Medicine | Admitting: *Deleted

## 2016-04-17 DIAGNOSIS — Z713 Dietary counseling and surveillance: Secondary | ICD-10-CM | POA: Diagnosis not present

## 2016-04-17 DIAGNOSIS — F502 Bulimia nervosa: Secondary | ICD-10-CM | POA: Diagnosis not present

## 2016-04-17 DIAGNOSIS — F5000 Anorexia nervosa, unspecified: Secondary | ICD-10-CM

## 2016-04-17 NOTE — Patient Instructions (Addendum)
[facility] [phone] Carry water with you Consider stop using food counting app. Consider yoga-
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
## 2016-05-02 ENCOUNTER — Encounter: Payer: 59 | Admitting: *Deleted

## 2016-05-02 DIAGNOSIS — F509 Eating disorder, unspecified: Secondary | ICD-10-CM

## 2016-05-02 DIAGNOSIS — Z713 Dietary counseling and surveillance: Secondary | ICD-10-CM | POA: Diagnosis not present

## 2016-05-02 NOTE — Patient Instructions (Addendum)
Consider adding more beans, fruit, and grains Ideally have some form of starch/carb each meal  Granola  Whole wheat crackers  Add pita to your veggies and hummus situation   Nutrition is important for physical endurance and strength Consider yoga   Probation officer

## 2016-05-02 NOTE — Progress Notes (Signed)
Appointment start time: 1100 Appointment end time: 1200  Patient was seen on 05/02/16 for nutrition counseling pertaining to disordered eating  Primary care provider: [facility].  Sue Lush Gist Therapist: Sarajane Jews at [facility] who does DBT Any other medical team members: none   Assessment [facility] with [facility].  Has plans to talk with them later today  struggling with the concept of not limiting to 2000 kcal/day Exercise, body image Is hoping to switch to day shifts Hasn't checked his weight since last visit Hasn't B/P in over a week!    Mental health diagnosis: AN, B/P type   Dietary assessment: A typical day consists of 3 meals and 2 snacks  Safe foods include: eggs, salad items, tofu, rice quinoa mix (in moderation), protein shakes, fruit or vegetable, veggie burgers fear foods include:candy, desserts  24 hour recall:  B: 3 eggs, bread, orange.  Sometimes bagel with cream cheese with onion and tomato S: hard boiled egg or veggies with hummus pr RX bar or Luna bar L: avocado with salad with veggies.  Added crutons.  Egg.  Sometimes tofu, tempeh, veggie burger on bun D: pizza (2 slices) S: Beverages: water, diet gingerale   Estimated energy intake: 1800-2000 kcal  Estimated energy needs: 2600-2800 kcal 350 g CHO 140 g pro 93 g fat  Nutrition Diagnosis: NI-1.4 Inadequate energy intake As related to eating disorder.  As evidenced by restriction, excessive exercise, and SIV.  Intervention/Goals: Nutrition counseling provided.  Watched [employer] video. He will process the concept that "one size fits all" is not appropriate Increase starches     Monitoring and Evaluation: Patient will follow up in 2 weeks.

## 2016-05-16 ENCOUNTER — Encounter: Payer: 59 | Attending: Family Medicine | Admitting: *Deleted

## 2016-05-16 DIAGNOSIS — F502 Bulimia nervosa: Secondary | ICD-10-CM | POA: Diagnosis not present

## 2016-05-16 DIAGNOSIS — Z713 Dietary counseling and surveillance: Secondary | ICD-10-CM | POA: Insufficient documentation

## 2016-05-16 DIAGNOSIS — F509 Eating disorder, unspecified: Secondary | ICD-10-CM

## 2016-05-16 NOTE — Progress Notes (Signed)
Appointment start time: 0745 Appointment end time: 0845  Patient was seen on 05/16/16 for nutrition counseling pertaining to disordered eating  [facility].  Alonna MiniumAndrea Gist Therapist: NA Any other medical team members: none   Assessment Still looking for therapist; [facility] was too expensive  Has been eating more starches.  Feels pretty neutral about that.  When he packs for work, he packs bread as a snack just in case.  Also brings baby carrots/broccoli Has been having more beans with quinoa.  Also eating chickpea salad  Has been eating more bagels and is proud of that.  Wants to eat more oatmeal  SIV yesterday.  Binged yesterday.  Realized it was because he didn't eat enough.        Mental health diagnosis: AN, B/P type   Dietary assessment: A typical day consists of 3 meals and 2 snacks  Safe foods include: eggs, salad items, tofu, rice quinoa mix (in moderation), protein shakes, fruit or vegetable, veggie burgers fear foods include:candy, desserts  B: 2 slices toast with with fake butter L: quinoa/bean/vegetable mixture with mozzarella cheese and avocado.  Chips D: sandwich with tofu, onion, mustard.  cheetohs.  siggy's yogurt (non fat) with PB granola added S: 2 lit and fit yogurt drinks Beverages: water, gingerale  Physical activity: none  Estimated energy intake: 1600-2000 kcal  Estimated energy needs: 2600-2800 kcal 350 g CHO 140 g pro 93 g fat  Nutrition Diagnosis: NI-1.4 Inadequate energy intake As related to eating disorder.  As evidenced by restriction, excessive exercise, and SIV.  Intervention/Goals: Nutrition counseling provided.  Challenged ED voice.  Suggested positive affirmation.  Gave suggestions on vegetarian proteins.  Try kefir.     Monitoring and Evaluation: Patient will follow up in 1 weeks.

## 2016-05-16 NOTE — Patient Instructions (Addendum)
Derek KeelKaren Sanchez [phone] in [place] Derek Sanchez [phone] in Derek Sanchez Derek Sanchez [phone]) [phone] in Derek Sanchez   Try kefir Eat enough during the day!  Eat about every 3-4 hours with protein, carbs, fat

## 2016-05-22 ENCOUNTER — Encounter: Payer: 59 | Admitting: *Deleted

## 2016-05-22 DIAGNOSIS — F509 Eating disorder, unspecified: Secondary | ICD-10-CM

## 2016-05-22 DIAGNOSIS — Z713 Dietary counseling and surveillance: Secondary | ICD-10-CM | POA: Diagnosis not present

## 2016-05-22 NOTE — Progress Notes (Signed)
Appointment start time: 0730 Appointment end time: 0830  Patient was seen on 05/22/16 for nutrition counseling pertaining to disordered eating  [facility].  Alonna MiniumAndrea Gist Therapist: NA Any other medical team members: none   Assessment Hasn't eaten since yesterday Thinks overall did well this week.  Binged twice and purged twice Called karen elliot and is waiting for a call back Ate better.  No urge to binge.  Energy better.  Sleeping better, but Was afraid that he was eating too much Weighed himself yesterday and didn't gain Only weighed himself once this past week And he realizes that's why he didn't eat.  He didn't like the number Isn't exercising due to fear of reinjuring knee ED voice is really loud  Got kefir and likes it  Wife has an eating disorder.  Just disclosed this today.  No one else knows.  States their anxiety feeds off each other Is thinking about medication.  Plans to call PCP today to ask for anxiety relief       Mental health diagnosis: AN, B/P type   Dietary assessment: A typical day consists of 3 meals and 2 snacks  Safe foods include: eggs, salad items, tofu, rice quinoa mix (in moderation), protein shakes, fruit or vegetable, veggie burgers fear foods include:candy, desserts  B: didn't have breakfast L: frozen pizza, potato wedges Grazed on potato wedges D: CFA cobb salad. Soda and milkshake Purged  B: blueberry muffin with banana S: baked jalapeno cheetohs L: 2 slices wheat cranberry bread, pimento cheese, tofu slices; pineapple D: thai food: tofu fried with vegetables and jasmine rice S: pretzel thins and greek yogurt ice cream bar  ED thinks this was reckless, but knows it was treating himself better  Physical activity: none  Estimated energy intake: 1600-2000 kcal  Estimated energy needs: 2600-2800 kcal 350 g CHO 140 g pro 93 g fat  Nutrition Diagnosis: NI-1.4 Inadequate energy intake As related to eating  disorder.  As evidenced by restriction, excessive exercise, and SIV.  Intervention/Goals: Nutrition counseling provided.  Challenged ED voice.  When he ate more, he had better energy and slept better.  When he eats less, he binges.  Gave some resources on ED in men   Monitoring and Evaluation: Patient will follow up in 3 weeks.

## 2016-06-13 ENCOUNTER — Ambulatory Visit: Payer: Self-pay | Admitting: *Deleted

## 2016-06-20 ENCOUNTER — Ambulatory Visit: Payer: Self-pay | Admitting: *Deleted

## 2016-07-04 ENCOUNTER — Encounter: Payer: 59 | Attending: Family Medicine | Admitting: *Deleted

## 2016-07-04 DIAGNOSIS — Z713 Dietary counseling and surveillance: Secondary | ICD-10-CM | POA: Diagnosis not present

## 2016-07-04 DIAGNOSIS — F502 Bulimia nervosa: Secondary | ICD-10-CM | POA: Insufficient documentation

## 2016-07-04 DIAGNOSIS — F509 Eating disorder, unspecified: Secondary | ICD-10-CM

## 2016-07-04 NOTE — Progress Notes (Signed)
Appointment start time: 1400  Appointment end time: 1500  Patient was seen on 07/04/16 for nutrition counseling pertaining to disordered eating  [facility].  Alonna MiniumAndrea Gist Therapist: NA Any other medical team members: none   Assessment [facility] [place] then [facility] He plans [facility], but still needs a therapist  Before baby he thought he was doing better. He was logging food, but not calories.  And he would reflect on how that felt a couple days later.  No B/P in 2 weeks When baby came, he was stressed and tired  Thinks things are somewhat improving. B/P, then came back to "normal frequency": 2-4 times/week.  At its worse it's 1+/day   Mental health diagnosis: AN, B/P type   Dietary assessment: A typical day consists of 3 meals and 2 snacks  Safe foods include: eggs, salad items, tofu, rice quinoa mix (in moderation), protein shakes, fruit or vegetable, veggie burgers fear foods include:candy, desserts  B: 3 eggs with munster cheese.  Toast with jelly L: tomato soup with crutons, pretzles S: chips and salsa D: budda bowl: rice, brussel sprouts, beans S: salad, whey protein bar (super busy with work)   Knows that isn't enough   Physical activity: none  Estimated energy intake: 1600-2000 kcal  Estimated energy needs: 2600-2800 kcal 350 g CHO 140 g pro 93 g fat  Nutrition Diagnosis: NI-1.4 Inadequate energy intake As related to eating disorder.  As evidenced by restriction, excessive exercise, and SIV.  Intervention/Goals: Nutrition counseling provided.  Challenged ED voice.  When he ate more, he had better energy and slept better.  When he eats less, he binges.  Suggested [facility]  Eat every 3-5 hours he's awake  Monitoring and Evaluation: Patient will follow up in 1 weeks.

## 2016-07-11 ENCOUNTER — Encounter: Payer: 59 | Attending: Family Medicine | Admitting: *Deleted

## 2016-07-11 DIAGNOSIS — Z713 Dietary counseling and surveillance: Secondary | ICD-10-CM | POA: Insufficient documentation

## 2016-07-11 DIAGNOSIS — F502 Bulimia nervosa: Secondary | ICD-10-CM | POA: Insufficient documentation

## 2016-07-11 DIAGNOSIS — F509 Eating disorder, unspecified: Secondary | ICD-10-CM

## 2016-07-11 NOTE — Progress Notes (Signed)
Appointment start time: 1500  Appointment end time: 1600  Patient was seen on 07/11/16 for nutrition counseling pertaining to disordered eating  [facility].  Alonna MiniumAndrea Gist Therapist: NA Any other medical team members: none   Assessment Is still looking for therapist.  No providers take [employer] except [facility] Bal[facility]rett and Paula ComptonKarla.  He wasn't happy with Paula ComptonKarla in the past.  Genelle BalBrett may or may not be accepting new clients... She will let me know next week  In-laws came this past week and that was fine.  Eating was fine.  He ate every 3 hours an date well-balanced meals Ate fear foods (nothing bundt cakes) Is applying at [employer].  Since they left, he restricted for 2 days and the past 2 days he's been eating "normally"  Does a lot of reflecting after a B/P cycle, but doesn't think much otherwise.  Doesn't reflect on recovery messages outside of that     Mental health diagnosis: AN, B/P type   Dietary assessment: A typical day consists of 3 meals and 2 snacks  Safe foods include: eggs, salad items, tofu, rice quinoa mix (in moderation), protein shakes, fruit or vegetable, veggie burgers fear foods include:candy, desserts  B: 3 eggs with munster cheese, peach S: possibly some veggie straws D: sandwich with cheese, veggies, oven baked fries, peach S: special K meal replacement bar S: egg sandwich S: egg sandwich B: yogurt with granola (larger bowl), peach    Physical activity: none  Estimated energy intake: 1600-2000 kcal  Estimated energy needs: 2600-2800 kcal 350 g CHO 140 g pro 93 g fat  Nutrition Diagnosis: NI-1.4 Inadequate energy intake As related to eating disorder.  As evidenced by restriction, excessive exercise, and SIV.  Intervention/Goals: Nutrition counseling provided.  Challenged ED voice.  Suggested support group, books, and "studying" for his recovery exam  Eat.   Monitoring and Evaluation: Patient will follow up in 2 weeks.

## 2016-07-11 NOTE — Patient Instructions (Addendum)
Adult eating disorder support group [facility] [phone] or [email or web address] Thursdays at 7-8:30  Man up to Eating Disorders by Demetrios IsaacsAndrew Walen Life Without Ed by Geroge BasemanJenni Schafer   When you don't get enough to eat, you will make up for it later.  Every time.

## 2016-07-23 ENCOUNTER — Encounter: Payer: 59 | Admitting: *Deleted

## 2016-07-23 DIAGNOSIS — Z713 Dietary counseling and surveillance: Secondary | ICD-10-CM | POA: Diagnosis not present

## 2016-07-23 DIAGNOSIS — F509 Eating disorder, unspecified: Secondary | ICD-10-CM

## 2016-07-23 NOTE — Progress Notes (Signed)
Appointment start time: 1100  Appointment end time: 1200  Patient was seen on 07/23/16 for nutrition counseling pertaining to disordered eating  [name]  Derek MiniumAndrea Sanchez Therapist: NA Any other medical team members: none   Assessment Has appointment with Derek DadBrett Sanchez for 8/2 Bought Life Without Ed and started reading it.  Is planning on doing the exercises. Named his eating disorder [name].  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  Is listening to audio book. Has appointment to get medication  Has SIV only 2-3 times.  Has been eating and feels like his body finally normalized and he is proud .  Didn't feel badly after eating.  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  Felt really good about it.  Felt more energized  When working he talked back to [place] and ate what he wanted.    Is going to [place] for family celebration.  Is feeling ok about being there, but struggling with the drive Has been drinking kefir and likes it.      Mental health diagnosis: AN, B/P type   Dietary assessment: A typical day consists of 3 meals and 2-3 snacks  Safe foods include: eggs, salad items, tofu, rice quinoa mix (in moderation), protein shakes, fruit or vegetable, veggie burgers fear foods include:candy, desserts  B: 2 eggs, sauted spinach.  Toast with jam S: pretzels Watermelon L: eggplant parm S: banana, carrots with hummus D: vegetable soup with chips S: Feeney's fro-yo Beverages:    Physical activity: none    Estimated energy needs: 2600-2800 kcal 350 g CHO 140 g pro 93 g fat  Nutrition Diagnosis: NI-1.4 Inadequate energy intake As related to eating disorder.  As evidenced by restriction, excessive exercise, and SIV.  Intervention/Goals: Nutrition counseling provided.  Praised efforts.  Reinforced "studying" for recovery.  Discussed exercise and going slow. [place] [facility]  Monitoring and Evaluation: Patient will follow up in 1 weeks.

## 2016-08-01 ENCOUNTER — Encounter: Payer: 59 | Admitting: *Deleted

## 2016-08-01 DIAGNOSIS — Z713 Dietary counseling and surveillance: Secondary | ICD-10-CM | POA: Diagnosis not present

## 2016-08-01 DIAGNOSIS — F509 Eating disorder, unspecified: Secondary | ICD-10-CM

## 2016-08-01 NOTE — Patient Instructions (Addendum)
On Facebook  We Are Man Enough  On Instagram: Notoriously Dapper The Every Man Project Matt Julio AlmJoseph Diaz Aaron Flores Ryan Sallans Binge Eating Confessions Charlann NossZach Miko  Books: Man Up to Eating Disorders Continue Life Without Ed  Development worker, [employer]cast [employer]

## 2016-08-01 NOTE — Progress Notes (Signed)
Appointment start [phone]:0830 Appointment end time: 0930  Patient was seen on 08/01/16 for nutrition counseling pertaining to disordered eating  [facility]  Alonna MiniumAndrea Gist Therapist: scheduled with [name] Any other medical team members: none   [place] and feels good about the choices he made. Brought nutthins, carrots, triscuits Lunch was yogurt with granola, sliced banana and smoothie for dinner had pizza that day.  Had 3 slices Stopped at [employer] on the way back and ate at their hot bar. Got chicken fried tofu and vegetable medley, sweet potato, corn slaw Some nutthins  B/P twice this past week.  [name] told him to regulate and not eat.   Got stuff for home gym (TRX workout).   Has been waking up hungry    Mental health diagnosis: AN, B/P type   Dietary assessment: A typical day consists of 3 meals and 2-3 snacks  Safe foods include: eggs, salad items, tofu, rice quinoa mix (in moderation), protein shakes, fruit or vegetable, veggie burgers fear foods include:candy, desserts  B: 3 eggs S: yogurt with granola, dried cherries and banana L: carrots, hummus tortilla chips, peach S: veggie straws D: loaded sweet potato with black beans, tofu, cheddar cheese, brussel sprouts, corn on the cob  Beverages: water, la croix, diluted lemonade (too sweet)   Physical activity: none    Estimated energy needs: 2600-2800 kcal 350 g CHO 140 g pro 93 g fat  Nutrition Diagnosis: NI-1.4 Inadequate energy intake As related to eating disorder.  As evidenced by restriction, excessive exercise, and SIV.  Intervention/Goals: Nutrition counseling provided.  Praised efforts. Reiterated recovery messages and challenge [name]/diet mentality.  Recommended books/poscasts/social media resources   Monitoring and Evaluation: Patient will follow up in 2 weeks.

## 2016-08-15 ENCOUNTER — Encounter: Payer: 59 | Attending: Family Medicine | Admitting: *Deleted

## 2016-08-15 DIAGNOSIS — F502 Bulimia nervosa: Secondary | ICD-10-CM | POA: Insufficient documentation

## 2016-08-15 DIAGNOSIS — Z713 Dietary counseling and surveillance: Secondary | ICD-10-CM | POA: Insufficient documentation

## 2016-08-15 DIAGNOSIS — F509 Eating disorder, unspecified: Secondary | ICD-10-CM

## 2016-08-15 NOTE — Patient Instructions (Signed)
Consider support group with [facility]  Thursdays 7-8:30  $45/session  [phone]

## 2016-08-15 NOTE — Progress Notes (Signed)
Appointment start time:1400 Appointment end time: 1500  Patient was seen on 08/15/16 for nutrition counseling pertaining to disordered eating  Primary care provider: [place] Gist Therapist:  Esperanza Heir Any other medical team members: none   Assessment Is sharing more with wife who also has ED.  That feels good, but would like more support SBI tryouts are in September and that is a struggle physically.  Is going to start training. There will be another try out in 6 months.  He's been feeling badly about his weight.  [name] (eating disorder) wants him to use behaviors.  He is talking back to [place].  He is listening to Life Without Ed on Audible.  The book is really affirming  Met with therapist and that went well  SIV twice/week.  Continues to stay low frequency.  Fighting [name].  Weighed himself last night and SIV since weight was higher than he expected (by 5 pounds)  He's anxious about this SBI test  Had been waking up hungry so he ate more last night.    Has appt for medication management, but thinking about going to see Bernita Buffy instead  Mental health diagnosis: AN, B/P type   Dietary assessment: A typical day consists of 3 meals and 2-3 snacks  Safe foods include: eggs, salad items, tofu, rice quinoa mix (in moderation), protein shakes, fruit or vegetable, veggie burgers fear foods include:candy, desserts  B: cinnamon raisin bagel with smart balance spread; OJ S: chocolate hummus, pretzel sticks L: large salad with veggies, 2 eggs, crutons, balsamic vinegarette S: 2 ziggy's yogurt with chocolate morsels and black cherry granola D: tofu, asparagus, egg noodles marinated in balsamic  S: feeney's fro-yo with PB sauce and reeses pieces S: graham crackers and PB and chocolate morsels Beverages:    Physical activity: none  Estimated energy needs: 2600-2800 kcal 350 g CHO 140 g pro 93 g fat  Nutrition Diagnosis: NI-1.4 Inadequate energy intake As related to  eating disorder.  As evidenced by restriction, excessive exercise, and SIV.  Intervention/Goals: Nutrition counseling provided.  Praised efforts. Reiterated recovery messages and challenge [name]/diet mentality.  Suggested medication with first available provider.  Suggested ED support group and he was interested.  Discussed needing to be fueled for his training and SBI physical test  Monitoring and Evaluation: Patient will follow up in 1 weeks.

## 2016-08-20 ENCOUNTER — Encounter: Payer: 59 | Admitting: *Deleted

## 2016-08-20 DIAGNOSIS — F509 Eating disorder, unspecified: Secondary | ICD-10-CM

## 2016-08-20 DIAGNOSIS — Z713 Dietary counseling and surveillance: Secondary | ICD-10-CM | POA: Diagnosis not present

## 2016-08-20 NOTE — Progress Notes (Signed)
Appointment start time:1100 Appointment end time: 1200  Patient was seen on 08/20/16 for nutrition counseling pertaining to disordered eating  [facility]  Sue LushAndrea Gist Therapist:  [name] Any other medical team members: none   Assessment Has appointment with Ricka BurdockSarah Webber scheduled for 8/21 Is training for his physical test for SBI, but is also thinking he might not pass.  He is ok with that and ok with trying again in 6 months. Thinks he is doing eating better and SIV was less.  1/week  EAT-26 score is 11 down from 17!!   Mental health diagnosis: AN, B/P type   Dietary assessment: A typical day consists of 3 meals and 2-3 snacks  Safe foods include: eggs, salad items, tofu, rice quinoa mix (in moderation), protein shakes, fruit or vegetable, veggie burgers fear foods include:candy, desserts  B: 3 eggs, bowl mixed fruit S: ziggy's yogurt L: large salad with veggies, eggs, avocado with generous portion of crutons S: watermelon and banana D: tofu, corn, grilled romaine hearts Run for 20 minutes S: 3 cups popcorn with spices Went to bed hungry S: woke up and binged and purged Beverages:   B: 2 waffles with PB and honey, cup yogurt   Physical activity: none  Estimated energy needs: 2600-2800 kcal 350 g CHO 140 g pro 93 g fat  Nutrition Diagnosis: NI-1.4 Inadequate energy intake As related to eating disorder.  As evidenced by restriction, excessive exercise, and SIV.  Intervention/Goals: Nutrition counseling provided.  Praised efforts. Reiterated recovery messages and challenge [name]/diet mentality.   Suggested talking with therapist about alternative coping skills   Monitoring and Evaluation: Patient will follow up in 2 weeks.

## 2016-08-27 ENCOUNTER — Ambulatory Visit (INDEPENDENT_AMBULATORY_CARE_PROVIDER_SITE_OTHER): Payer: 59 | Admitting: Physician Assistant

## 2016-08-27 ENCOUNTER — Encounter: Payer: Self-pay | Admitting: Physician Assistant

## 2016-08-27 DIAGNOSIS — F509 Eating disorder, unspecified: Secondary | ICD-10-CM | POA: Diagnosis not present

## 2016-08-27 MED ORDER — ESCITALOPRAM OXALATE 10 MG PO TABS: ORAL_TABLET | ORAL | 1 refills | Status: DC

## 2016-08-27 NOTE — Progress Notes (Signed)
Derek Sanchez  MRN: [id number] DOB: 1985-10-29  PCP: Derek Riddle, PA-C  Chief Complaint  Patient presents with  . Medication Refill    needs to discuss lexapro  . paperwork    Subjective:  Pt presents to clinic for interest in restarting his Lexapro.  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  He is a new father of a daughter 3 months ago.  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  Currently eating is better.  He is trying to eat 3 meals a day and snack.  There are days that he does not eat enough and then he gets tired and that causes him to binge on any food available to him and then afterwards he does not feel good which triggers urging by vomiting.  He binges and purges about once a week and he is working on eating enough through the day so he is not hungry.    Current exercising - push up and sit ups - every hour 20 of so of each - run 1.5 miles and then sprint the next day and then a rest day -- about an hour a day total of exercise  Seeing Derek Sanchez again for nutritional therapy - seeing her once a week but may change to every other week Derek Sanchez - therapist - once a week  He has benefits through the [place] but he has found they are not equipped for eating disorder (it was a substance abuse clinic that thought it would work due to the addiction aspect of eating disorders) and he plans to seek care here as he was doing in the past because that makes him most comfortable.  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  Review of Systems    Constitutional: Negative for chills and fever.  Respiratory: Negative for shortness of breath.   Cardiovascular: Negative for chest pain, palpitations and leg swelling.  Musculoskeletal: Negative.        Released from ortho surgeon from ACL repair about 1 year ago    Patient Active Problem List   Diagnosis Date Noted  . GERD (gastroesophageal reflux disease) 08/17/2014  . Disordered eating 05/18/2014  . Stress reaction 05/18/2014    Current Outpatient Prescriptions on File Prior to Visit  Medication Sig Dispense Refill  . omeprazole (PRILOSEC) 20 MG capsule Take 1 capsule (20 mg total) by mouth daily. 90 capsule 3   No current facility-administered medications on file prior to visit.     No Active Allergies  Past Medical History:  Diagnosis Date  . Allergy   . Anxiety   . Eating disorder    Social History   Social History Narrative   Lives with wife and daughter (born 05/2016)   Social History  Substance Use Topics  . Smoking status: Never Smoker  . Smokeless tobacco: Never Used  . Alcohol use 1.2 oz/week    2 Standard drinks or equivalent per week  family history includes Cancer in his other; Diabetes in his mother; Mental illness in his brother.     Objective:  BP 117/72   Pulse 62   Temp 97.8 F (36.6 C) (Oral)   Resp 18   Ht 5\' 10"  (1.778 m)   SpO2 98%  There is no height or weight on file to calculate BMI.  Physical Exam  Constitutional: He is oriented to person, place, and time and well-developed, well-nourished, and in no distress.  HENT:  Head: Normocephalic and atraumatic.  Right Ear: External ear normal.  Left Ear: External ear normal.  Eyes: Conjunctivae are normal.  Neck: Normal range of motion.  Cardiovascular: Normal rate, regular rhythm and normal heart sounds.   No murmur heard. Pulmonary/Chest: Effort normal and breath sounds normal. He has no wheezes.  Neurological: He is alert and oriented to person, place, and time. Gait normal.   Skin: Skin is warm and dry.  Psychiatric: Mood, memory, affect and judgment normal.    Assessment and Plan :  Disordered eating - Plan: escitalopram (LEXAPRO) 10 MG tablet Start SSRI to help with stress reaction to food and resulting anxiety to binging. Recheck in 6 weeks.  Form filled out for training program.  Derek Lennert PA-C  Primary Care at [facility] 08/27/2016 12:17 PM

## 2016-08-27 NOTE — Patient Instructions (Signed)
     IF you received an x-ray today, you will receive an invoice from [facility]. Please contact [facility] at [phone] with questions or concerns regarding your invoice.   IF you received labwork today, you will receive an invoice from [facility]. Please contact [facility] at [phone] with questions or concerns regarding your invoice.   Our billing staff will not be able to assist you with questions regarding bills from these companies.  You will be contacted with the lab results as soon as they are available. The fastest way to get your results is to activate your My Chart account. Instructions are located on the last page of this paperwork. If you have not heard from us regarding the results in 2 weeks, please contact this office.     

## 2016-09-03 ENCOUNTER — Ambulatory Visit: Payer: Self-pay | Admitting: *Deleted

## 2016-09-10 ENCOUNTER — Encounter: Payer: 59 | Attending: Family Medicine | Admitting: *Deleted

## 2016-09-10 DIAGNOSIS — F502 Bulimia nervosa: Secondary | ICD-10-CM | POA: Diagnosis not present

## 2016-09-10 DIAGNOSIS — Z713 Dietary counseling and surveillance: Secondary | ICD-10-CM | POA: Insufficient documentation

## 2016-09-10 DIAGNOSIS — F509 Eating disorder, unspecified: Secondary | ICD-10-CM

## 2016-09-10 NOTE — Progress Notes (Signed)
Appointment start time:1100 Appointment end time: 1200  Patient was seen on 09/09/16 for nutrition counseling pertaining to disordered eating  [facility]  Sue LushAndrea Gist Therapist:  [name] Any other medical team members: none   Assessment Started Lexapro 10 mg and that is going ok.   Has SBI fitness test this Thursday and is feeling more optimistic  Eating is going pretty well.  B/P 1/week which is an improvement.  Attributes this to paying attention to his internal cues and reading recovery materials.  Isn't journaling, but is reflecting.  Binges happen when he doesn't eat enough. Is no longer waking up and eating at night.   Might get busy or forget to eat/plan accordingly and doesn't eat enough Is trying to honor hunger cues.     Mental health diagnosis: AN, B/P type   Dietary assessment: A typical day consists of 3 meals and 2-3 snacks  Safe foods include: eggs, salad items, tofu, rice quinoa mix (in moderation), protein shakes, fruit or vegetable, veggie burgers fear foods include:candy, desserts  B: muffin cup from [employer]kodiak cake, apple S: assorted sweedish fish S: pita chips, hummus L: quinoa bowl with chickpea, veggies S: veggies and hummus D: veggie burger salad with avocado lime dressing with crutons S: graham crackers, PB and chocolate chips Beverages: water, lacroix, beer  Normally 3 meals and 3-4snacks   Physical activity: training for fitness test.  Running and body weight  Estimated energy needs: 2600-2800 kcal 350 g CHO 140 g pro 93 g fat  Nutrition Diagnosis: NI-1.4 Inadequate energy intake As related to eating disorder.  As evidenced by restriction, excessive exercise, and SIV.  Intervention/Goals: Nutrition counseling provided.  Praised efforts. Reiterated recovery messages and challenge [name]/diet mentality.  He binges on fear foods.  Consider incorporating more dense/satisfying foods into the day to day    Monitoring and  Evaluation: Patient will follow up in 1 weeks.

## 2016-09-17 ENCOUNTER — Encounter: Payer: 59 | Admitting: *Deleted

## 2016-09-17 DIAGNOSIS — Z713 Dietary counseling and surveillance: Secondary | ICD-10-CM | POA: Diagnosis not present

## 2016-09-17 DIAGNOSIS — F509 Eating disorder, unspecified: Secondary | ICD-10-CM

## 2016-09-17 NOTE — Progress Notes (Signed)
Appointment start time:1100 Appointment end time: 1200  Patient was seen on 09/17/16 for nutrition counseling pertaining to disordered eating  [facility]  Sue LushAndrea Gist Therapist:  [name] Any other medical team members: none   Assessment No B/P this week.  Thinks it's because of eating more freely on Mondays/weekend and that is what triggers B/P cycle Tried to talk through it Is sleeping through the night and wakes up comfortable.  Has better energy throughout the day   Reading Life Without Ed, but slowly.  Spends more time with baby. Bought some new clothes  Is honoring hunger cues and food preferences Is trying to have more dense foods   Mental health diagnosis: AN, B/P type   Dietary assessment: A typical day consists of 3 meals and 2-3 snacks  Safe foods include: eggs, salad items, tofu, rice quinoa mix (in moderation), protein shakes, fruit or vegetable, veggie burgers fear foods include:candy, desserts  B: kale, banana smoothie S: trail mix with raisins, almonds, cashews, M&Ms L: salad with tofu,veggies, cheese, avocado, dressing S: tortilla chips and salsa D: quinoa, brussels, mushroom meatballs S: yasso bar, frozen yogurt with PB sauce and reese pieces Beverages: water, lacroix, beer  Normally 3 meals and 3-4snacks   Physical activity: training for fitness test.  Running and body weight  Estimated energy needs: 2600-2800 kcal 350 g CHO 140 g pro 93 g fat  Nutrition Diagnosis: NI-1.4 Inadequate energy intake As related to eating disorder.  As evidenced by restriction, excessive exercise, and SIV.  Intervention/Goals: Nutrition counseling provided.  Praised efforts. Reiterated recovery messages and challenge [name]/diet mentality.      Monitoring and Evaluation: Patient will follow up in 2 weeks.

## 2016-09-24 ENCOUNTER — Ambulatory Visit: Payer: Self-pay | Admitting: *Deleted

## 2016-10-01 ENCOUNTER — Encounter: Payer: Self-pay | Admitting: Physician Assistant

## 2016-10-01 ENCOUNTER — Ambulatory Visit (INDEPENDENT_AMBULATORY_CARE_PROVIDER_SITE_OTHER): Payer: 59 | Admitting: Physician Assistant

## 2016-10-01 ENCOUNTER — Encounter: Payer: 59 | Admitting: *Deleted

## 2016-10-01 VITALS — BP 120/70 | HR 77 | Resp 16 | Ht 70.0 in | Wt 200.2 lb

## 2016-10-01 DIAGNOSIS — Z713 Dietary counseling and surveillance: Secondary | ICD-10-CM | POA: Diagnosis not present

## 2016-10-01 DIAGNOSIS — M7652 Patellar tendinitis, left knee: Secondary | ICD-10-CM

## 2016-10-01 DIAGNOSIS — F509 Eating disorder, unspecified: Secondary | ICD-10-CM

## 2016-10-01 MED ORDER — MELOXICAM 7.5 MG PO TABS: 7.5000 mg | ORAL_TABLET | Freq: Every day | ORAL | 0 refills | Status: AC

## 2016-10-01 MED ORDER — ESCITALOPRAM OXALATE 10 MG PO TABS: ORAL_TABLET | ORAL | 0 refills | Status: DC

## 2016-10-01 NOTE — Patient Instructions (Signed)
[email or web address]

## 2016-10-01 NOTE — Progress Notes (Signed)
Appointment start time:1100 Appointment end time: 1200  Patient was seen on 10/01/16 for nutrition counseling pertaining to disordered eating  [facility].  Sue Lush Gist Therapist:  [name] Any other medical team members: none   Assessment Things are going ok.  There was a slight slide backwards, but he thinks things are moving forward again.  Hadn't weighed himself, but did (to checked daughter's weight).  He was really upset and surprised by his weight.  That was a rough couple days.  SIV once and restricted for 2 days.  He then got back on track.  He hasn't been working out because he messed up his good knee since the fitness test.  Is seeing his provider tomorrow.    No N/V Heartburn when he forgets Prilosec Normal BM   Mental health diagnosis: AN, B/P type   Dietary assessment: A typical day consists of 3 meals and 2-3 snacks  Safe foods include: eggs, salad items, tofu, rice quinoa mix (in moderation), protein shakes, fruit or vegetable, veggie burgers fear foods include:candy, desserts  B: 3 eggs, toast with butter Orange L:missed it due to power washing S: chips (doritos) donut holes D: pasta, pumpkin sauce, mushroom and tofu meatballs S: graham crackers, PB, chocolate S: more pasta Beverages: water (not enough)   Physical activity:none  Estimated energy needs: 2600-2800 kcal 350 g CHO 140 g pro 93 g fat  Nutrition Diagnosis: NI-1.4 Inadequate energy intake As related to eating disorder.  As evidenced by restriction, excessive exercise, and SIV.  Intervention/Goals: Nutrition counseling provided. Praised efforts. Reiterated recovery messages and challenge [name]/diet mentality.      Monitoring and Evaluation: Patient will follow up in 2 weeks.

## 2016-10-01 NOTE — Patient Instructions (Signed)
     IF you received an x-ray today, you will receive an invoice from [facility]. Please contact [facility] at [phone] with questions or concerns regarding your invoice.   IF you received labwork today, you will receive an invoice from [facility]. Please contact [facility] at [phone] with questions or concerns regarding your invoice.   Our billing staff will not be able to assist you with questions regarding bills from these companies.  You will be contacted with the lab results as soon as they are available. The fastest way to get your results is to activate your My Chart account. Instructions are located on the last page of this paperwork. If you have not heard from us regarding the results in 2 weeks, please contact this office.     

## 2016-10-01 NOTE — Progress Notes (Signed)
Derek Sanchez  MRN: [id number] DOB: 04-02-1985  PCP: Morrell Riddle, PA-C  Chief Complaint  Patient presents with  . Knee Pain    left knee pain    Subjective:  Pt presents to clinic for left knee pain which he has had for about a month.  He was sprinting and training for new position and he started to have pain.  He has been unable to continue training due to pain.  The pain has not changed location.  It has decreased in intensity since he stopped exercising.  He does have [employer] run in October for physical fitness that he has to do and he wants to make sure he is ok to do that.  He has no popping, clicking or giving away with his knee.    Lexapro - he is tolerating well - he does sometimes miss his dose.  He feels like his anxiety is better controlled.  He is eating better and less concerns about what he is eating.  He has gained weight and he is ok with that because he feels like he is more healthy now than he was.  Things are going well at home with child. Starting back to work this week from his paternity leave.  History is obtained by patient.  Review of Systems  Musculoskeletal: Positive for arthralgias (left knee). Negative for joint swelling.    Patient Active Problem List   Diagnosis Date Noted  . GERD (gastroesophageal reflux disease) 08/17/2014  . Disordered eating 05/18/2014  . Stress reaction 05/18/2014    Current Outpatient Prescriptions on File Prior to Visit  Medication Sig Dispense Refill  . cetirizine (ZYRTEC) 10 MG tablet Take 10 mg by mouth.    Marland Kitchen omeprazole (PRILOSEC) 20 MG capsule Take 1 capsule (20 mg total) by mouth daily. 90 capsule 3   No current facility-administered medications on file prior to visit.     No Known Allergies  Past Medical History:  Diagnosis Date  . Allergy   . Anxiety   . 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     Social History  Substance Use Topics  . Smoking status: Never Smoker  . Smokeless tobacco: Never Used  . Alcohol use 1.2 oz/week    2 Standard drinks or equivalent per week   family history includes Cancer in his other; Diabetes in his mother; Mental illness in his brother.     Objective:  BP 120/70   Pulse 77   Resp 16   Ht  (1.778 m)   Wt 200 lb 3.2 oz (90.8 kg)   SpO2 97%   BMI 28.73 kg/m  Body mass index is 28.73 kg/m.  Physical Exam  Constitutional: He is oriented to person, place, and time and well-developed, well-nourished, and in no distress.  HENT:  Head: Normocephalic and atraumatic.  Right Ear: External ear normal.  Left Ear: External ear normal.  Eyes: Conjunctivae are normal.  Neck: Normal range of motion.  Pulmonary/Chest: Effort normal.  Musculoskeletal:       Left knee: He exhibits normal range of motion, no swelling, no LCL laxity, no bony tenderness, normal meniscus and no MCL laxity. Tenderness found. Patellar tendon (worse with quads engaged) tenderness noted. No medial joint line, no lateral joint line, no MCL and no LCL tenderness noted.  Legs: Neurological: He is alert and oriented to person, place, and time. Gait normal.  Skin: Skin is warm and dry.  Psychiatric: Mood, memory, affect and judgment normal.    Assessment and Plan :  Patellar tendinitis of left knee - Plan: Ambulatory referral to Physical Therapy, meloxicam (MOBIC) 7.5 MG tablet - start PT - d/w pt use of Chopat's strap - decrease exercises that engage quad.  NSAIDs to help with internal inflammation.  Disordered eating - Plan: escitalopram (LEXAPRO) 10 MG tablet - continue treatment - continue therapy.  Benny Lennert PA-C  Primary Care at [facility] 10/01/2016 4:33 PM

## 2016-10-08 ENCOUNTER — Ambulatory Visit: Payer: 59 | Admitting: Physician Assistant

## 2016-10-16 ENCOUNTER — Ambulatory Visit: Payer: Self-pay | Admitting: *Deleted

## 2016-10-16 ENCOUNTER — Telehealth: Payer: Self-pay | Admitting: Physician Assistant

## 2016-10-16 NOTE — Telephone Encounter (Signed)
Carollee Herter from [employer] called checking on status of fax she said they sent over last week concerning pt physical therapy. She said they cannot go further with the patient's treatment without a signature. Please advise. Carollee Herter can be reached at [phone].

## 2016-10-17 ENCOUNTER — Encounter: Payer: Self-pay | Admitting: Physician Assistant

## 2016-10-18 NOTE — Telephone Encounter (Signed)
Please see note below....we will fax on Monday when you come in

## 2016-10-18 NOTE — Telephone Encounter (Signed)
Received the fax from shannon at [facility].Maralyn Sago needs to sign.. pt has an appt on Monday oct 15 at 2:30 it needs to be signed and faxed back to [phone] before pt appt.. The paperwork is in sarah box in 104  Please advise

## 2016-10-21 NOTE — Telephone Encounter (Signed)
Can someone see if they can find this for me to sign please. If it can not be found please call the office - a verbal ok is fine with me or get them to fax again please.

## 2016-10-31 ENCOUNTER — Encounter: Payer: 59 | Attending: Family Medicine | Admitting: *Deleted

## 2016-10-31 DIAGNOSIS — F502 Bulimia nervosa: Secondary | ICD-10-CM | POA: Insufficient documentation

## 2016-10-31 DIAGNOSIS — F509 Eating disorder, unspecified: Secondary | ICD-10-CM

## 2016-10-31 DIAGNOSIS — Z713 Dietary counseling and surveillance: Secondary | ICD-10-CM | POA: Diagnosis not present

## 2016-10-31 NOTE — Progress Notes (Signed)
Appointment start time:1400 Appointment end time: 1445  Patient was seen on 10/31/16 for nutrition counseling pertaining to disordered eating  [facility]  Sue LushAndrea Gist Therapist:  [name] Any other medical team members: none   Assessment Has been sick/had laryngitis.  More than likely will be moving to day shift [facility] in addition to [facility].  Two visits so far.  Is also doing physical therapy for his other knee  Not sleeping well Poor energy Eating is poor Thinks that affects everything  Thinks he was eating more than he should because he was not active (due to illness).  Clothes don't fit.  Weighs 200 lb Knows he isn't too big, but it's still uncomfortable.  Has been using behaviors and is sick of it.  It affects everything  Thinks he needs to work out in an appropriate way  Thinks the physical fitness test coming up could be a trigger  Listening to Life Without Ed.  Not doing the exercises  Increased SIV  Mental health diagnosis: AN, B/P type   Dietary assessment: A typical day consists of 3 meals and 2-3 snacks  Safe foods include: eggs, salad items, tofu, rice quinoa mix (in moderation), protein shakes, fruit or vegetable, veggie burgers fear foods include:candy, desserts  B:none S: tea, pumpkin muffin L: none D: Quinoa, cashew, tempeh   Physical activity:none  Estimated energy needs: 2600-2800 kcal 350 g CHO 140 g pro 93 g fat  Nutrition Diagnosis: NI-1.4 Inadequate energy intake As related to eating disorder.  As evidenced by restriction, excessive exercise, and SIV.  Intervention/Goals: Nutrition counseling provided. Try podcasts at work and book at home and complete the exercises.  TransMontaigneChallenged [name].  Reiterated recovery messages   Monitoring and Evaluation: Patient will follow up in 2 weeks.

## 2016-11-12 ENCOUNTER — Encounter: Payer: 59 | Attending: Family Medicine | Admitting: *Deleted

## 2016-11-12 DIAGNOSIS — Z713 Dietary counseling and surveillance: Secondary | ICD-10-CM | POA: Diagnosis present

## 2016-11-12 DIAGNOSIS — F509 Eating disorder, unspecified: Secondary | ICD-10-CM

## 2016-11-12 DIAGNOSIS — F502 Bulimia nervosa: Secondary | ICD-10-CM | POA: Insufficient documentation

## 2016-11-12 NOTE — Progress Notes (Signed)
Appointment start time:1030 Appointment end time: 1130  Patient was seen on 11/12/16 for nutrition counseling pertaining to disordered eating  [facility]  Sue LushAndrea Gist Therapist:  [name] Any other medical team members: none   Assessment Wrote Declaration from [employer] .  Is reading again Life without Ed. Has not been able to listen to podcasts  Struggling with wanting to skip meals.  Knows that triggers binges Sleep is a problem Not exercising due to knee injury Cancelled appointment with Ladona Rid[facility]aylor.  Sees [facility] Bal[facility]rett this week Stopped lexapro for 3 days and then started back.  Thinks it helps anxiety.  Still on a low dose.  Open to increasing No SIV in the last week  Mental health diagnosis: AN, B/P type   Dietary assessment: A typical day consists of 3 meals and 2-3 snacks  Safe foods include: eggs, salad items, tofu, rice quinoa mix (in moderation), protein shakes, fruit or vegetable, veggie burgers fear foods include:candy, desserts  B: yogurt with trail mix  L: missed/can't remember S: trail mix, veggie straws, oranges D: black bean burger with avocado, onion on bun with 2 slices munster cheese, sweet potato fries S: frozen yogurt   Physical activity:none  Estimated energy needs: 2600-2800 kcal 350 g CHO 140 g pro 93 g fat  Nutrition Diagnosis: NI-1.4 Inadequate energy intake As related to eating disorder.  As evidenced by restriction, excessive exercise, and SIV.  Intervention/Goals: Nutrition counseling provided.  TransMontaigneChallenged [name].  Reiterated recovery messages.  Eat every 3-5 waking hours.  Consider increasing lexapro.  Talk with Huntley DecSara   Monitoring and Evaluation: Patient will follow up in 1 weeks.

## 2016-11-19 ENCOUNTER — Encounter: Payer: 59 | Admitting: *Deleted

## 2016-11-19 DIAGNOSIS — Z713 Dietary counseling and surveillance: Secondary | ICD-10-CM | POA: Diagnosis not present

## 2016-11-19 DIAGNOSIS — F509 Eating disorder, unspecified: Secondary | ICD-10-CM

## 2016-11-19 NOTE — Progress Notes (Signed)
Appointment start time:1000 Appointment end time: 1100  Patient was seen on 11/19/16 for nutrition counseling pertaining to disordered eating  Primary care provider: [name] Therapist:  [name] Any other medical team members: none   Assessment Has been struggling with external stuff. Is seeing therapist this week.   dug deeper with brett last week and that was good.  relizes he's trying to take short cuts Is going to try to challenge those thoughts.  Hasn't done any additional reading, but is trying to eat   Not sleeping well.  Stressed.  Eating is poor, but improving by having larger breakfast and writing in journal and challenging [name] before meals   Dietary assessment: A typical day consists of 3 meals and 2-3 snacks  Safe foods include: eggs, salad items, tofu, rice quinoa mix (in moderation), protein shakes, fruit or vegetable, veggie burgers fear foods include:candy, desserts  B: 3 eggs, veggie bacon S: yogurt with pb granola S: tortilla chips and orange L: greek pita filaphel and fries S: pumpkin loaf D: lentil stew with vegetables on egg noodles S: yaso bar, pretzels with mustard. Beverages: water, diluted lemonade, gingerale   Physical activity:none  Estimated energy needs: 2600-2800 kcal 350 g CHO 140 g pro 93 g fat  Nutrition Diagnosis: NI-1.4 Inadequate energy intake As related to eating disorder.  As evidenced by restriction, excessive exercise, and SIV.  Intervention/Goals: Nutrition counseling provided.   1. Stand next to horse: coming to appointments, reading/journaling 2. Foot in stirrup: 3 meals a day-  3. Hands on saddle: balanced with starch, protein, and fat 4. Swing leg over the horse: snacks in between 5. Foot in other stirrup: listening to internal cues 6.  Give horse direction: planning meals and snacks 6.  Horse tries to steer you in another directions:  Pull the reins!  7. you get lost: lean on support 8. Horse bucks you off: get  back on 9. Wear protective gear: read, listen, see diverse bodies    Consider increasing lexapro.  Talk with Huntley DecSara   Monitoring and Evaluation: Patient will follow up in 1 weeks.

## 2016-11-19 NOTE — Patient Instructions (Addendum)
1. Stand next to horse: coming to appointments, reading/journaling 2. Foot in stirrup: 3 meals a day-  3. Hands on saddle: balanced with starch, protein, and fat 4. Swing leg over the horse: snacks in between 5. Foot in other stirrup: listening to internal cues 6.  Give horse direction: planning meals and snacks 6.  Horse tries to steer you in another directions:  Pull the reins!  7. you get lost: lean on support 8. Horse bucks you off: get back on 9. Wear protective gear: read, listen, see diverse bodies

## 2016-11-21 ENCOUNTER — Encounter: Payer: Self-pay | Admitting: Physician Assistant

## 2016-11-21 DIAGNOSIS — F509 Eating disorder, unspecified: Secondary | ICD-10-CM

## 2016-11-26 ENCOUNTER — Encounter: Payer: 59 | Admitting: *Deleted

## 2016-11-26 DIAGNOSIS — Z713 Dietary counseling and surveillance: Secondary | ICD-10-CM | POA: Diagnosis not present

## 2016-11-26 DIAGNOSIS — F509 Eating disorder, unspecified: Secondary | ICD-10-CM

## 2016-11-26 NOTE — Patient Instructions (Addendum)
Work days:  Eat when you wake up (meal) Eat before work  Eat around midnight Eat around [employer]3-4ish Eat before sleep  1. Stand next to horse: coming to appointments, reading/journaling 2. Foot in stirrup: 3 meals a day-  3. Hands on saddle: balanced with starch, protein, and fat 4. Swing leg over the horse: snacks in between 5. Foot in other stirrup: listening to internal cues 6.  Give horse direction: planning meals and snacks 6.  Horse tries to steer you in another directions:  Pull the reins!  7. you get lost: lean on support 8. Horse bucks you off: get back on 9. Wear protective gear: read, listen, see diverse bodies

## 2016-11-26 NOTE — Progress Notes (Signed)
Appointment start time:1100 Appointment end time: 1145  Patient was seen on 11/26/16 for nutrition counseling pertaining to disordered eating  Primary care provider: [name] Therapist:  [name] Any other medical team members: none   Assessment Has been having headaches lately.  Probably stressed. Has been overworked.  Seeing therapist weekly Need another refill and also wants addition medication support Inadequate fluids Thinks he is sleeping better, but still tired.  Feels like a zombie  3 meals/day when he isn't working. 2 meals when he works   Dietary assessment: A typical day consists of 3 meals and 2-3 snacks  Safe foods include: eggs, salad items, tofu, rice quinoa mix (in moderation), protein shakes, fruit or vegetable, veggie burgers fear foods include:candy, desserts  B: toast and orange L: salad S: tortilla chips D: rice, bean, tofu sausage, stewed tomatoes.  bread  B: 3 egg omlete with cheese and bread L: plans on eating lunch and buy a snack D: plans on stew for dinner   Physical activity:none  Estimated energy needs: 2600-2800 kcal 350 g CHO 140 g pro 93 g fat  Nutrition Diagnosis: NI-1.4 Inadequate energy intake As related to eating disorder.  As evidenced by restriction, excessive exercise, and SIV.  Intervention/Goals: Nutrition counseling provided.    Work days:  Eat when you wake up (meal) Eat before work  Eat around midnight Eat around [employer]3-4ish Eat before sleep   1. Stand next to horse: coming to appointments, reading/journaling 2. Foot in stirrup: 3 meals a day-  3. Hands on saddle: balanced with starch, protein, and fat 4. Swing leg over the horse: snacks in between 5. Foot in other stirrup: listening to internal cues 6.  Give horse direction: planning meals and snacks 6.  Horse tries to steer you in another directions:  Pull the reins!  7. you get lost: lean on support 8. Horse bucks you off: get back on 9. Wear protective  gear: read, listen, see diverse bodies    Consider increasing lexapro.  Talk with Huntley DecSara   Monitoring and Evaluation: Patient will follow up in 2 weeks.

## 2016-12-03 MED ORDER — ESCITALOPRAM OXALATE 20 MG PO TABS: 20.0000 mg | ORAL_TABLET | Freq: Every day | ORAL | 1 refills | Status: DC

## 2016-12-03 NOTE — Telephone Encounter (Signed)
Meds ordered this encounter  Medications  . escitalopram (LEXAPRO) 20 MG tablet    Sig: Take 1 tablet (20 mg total) by mouth daily. TAKE 1 TABLET (10 MG TOTAL) BY MOUTH DAILY.    Dispense:  90 tablet    Refill:  1    Order Specific Question:   Supervising Provider    Answer:   Sherren MochaSHAW, EVA N [phone][[id number]]    Patient notified via My Chart.

## 2016-12-03 NOTE — Addendum Note (Signed)
Addended by: Fernande BrasJEFFERY, Garyn Arlotta S on: 12/03/2016 06:47 PM   Modules accepted: Orders

## 2016-12-11 ENCOUNTER — Encounter: Payer: 59 | Attending: Family Medicine | Admitting: *Deleted

## 2016-12-11 DIAGNOSIS — F509 Eating disorder, unspecified: Secondary | ICD-10-CM

## 2016-12-11 DIAGNOSIS — F502 Bulimia nervosa: Secondary | ICD-10-CM | POA: Diagnosis not present

## 2016-12-11 DIAGNOSIS — Z713 Dietary counseling and surveillance: Secondary | ICD-10-CM | POA: Insufficient documentation

## 2016-12-11 NOTE — Progress Notes (Signed)
Appointment start time:1400 Appointment end time: 1500  Patient was seen on 12/11/16 for nutrition counseling pertaining to disordered eating  Primary care provider: [name] Therapist:  [name] Any other medical team members: none   Assessment Will be switched to day shift Is going to pick up his medication refill today.  Increased to 20 mg Thinks eating is going ok  No stomachaches. No N/V Some minimal acid reflux when he doesn't get his medication Normal BM  No dizziness.  No headaches Sleeping well, back pain  Not eating enough actually.   Increased binges- knows it's because he goes too long without eat   Dietary assessment: A typical day consists of 3 meals and 2-3 snacks  Safe foods include: eggs, salad items, tofu, rice quinoa mix (in moderation), protein shakes, fruit or vegetable, veggie burgers fear foods include:candy, desserts   B: yogurt with granola L: chorizo with brown rice quinoa and  Avocado S: pretzels, poptart S egg salad sandwich, gummy worms S: garden salad with cheese, dressing, biscuit with jelly  Trail mix with M&Ms Sun chips, NuGu bar, gingerale  Physical activity:none  Estimated energy needs: 2600-2800 kcal 350 g CHO 140 g pro 93 g fat  Nutrition Diagnosis: NI-1.4 Inadequate energy intake As related to eating disorder.  As evidenced by restriction, excessive exercise, and SIV.  Intervention/Goals: Nutrition counseling provided.  Please eat before going into work.  Eating adequately protects against binging.  Consider preparing dinner so that you can eat on time before going in.  Eating has improved, but is inadequate still    Monitoring and Evaluation: Patient will follow up in 2 weeks.

## 2016-12-24 ENCOUNTER — Ambulatory Visit: Payer: 59 | Admitting: Physician Assistant

## 2016-12-25 ENCOUNTER — Ambulatory Visit: Payer: Self-pay | Admitting: *Deleted

## 2017-01-08 ENCOUNTER — Encounter: Payer: 59 | Attending: Family Medicine | Admitting: *Deleted

## 2017-01-08 DIAGNOSIS — F509 Eating disorder, unspecified: Secondary | ICD-10-CM

## 2017-01-08 DIAGNOSIS — F502 Bulimia nervosa: Secondary | ICD-10-CM | POA: Insufficient documentation

## 2017-01-08 DIAGNOSIS — Z713 Dietary counseling and surveillance: Secondary | ICD-10-CM | POA: Diagnosis not present

## 2017-01-08 NOTE — Progress Notes (Signed)
Appointment start time 0800 Appointment end time: 0900  Patient was seen on 01/08/17 for nutrition counseling pertaining to disordered eating  Primary care provider: [name] Therapist:  [name] Any other medical team members: none   Assessment Officially switched to day shift Thinks eating is better as a result Going back to school February 1.   Energy is improved No dizziness No GI distress.  Normal BM No B/P in over 2 weeks.  No binging over the holidays Thinks increased Lexapro is really helping.  Mood is good.  Nothing is looming   Dietary assessment: A typical day consists of 3 meals and 2-3 snacks  Safe foods include: eggs, salad items, tofu, rice quinoa mix (in moderation), protein shakes, fruit or vegetable, veggie burgers fear foods include:candy, desserts  B: yogurt with granola, fruit, coffee L: sandwich with cheese and tofu, sunchips S: hummus and carrots D: tofu roast stuffed with vegetables, greens, black eye peas, mashed potatoes S: graham crackers, chocolate, PB S: trail mix, orange Beverages: water.  More adequate  Physical activity:none; plans to start today: cardio of elliptical or running and P90X   Estimated energy needs: 2600-2800 kcal 350 g CHO 140 g pro 93 g fat  Nutrition Diagnosis: NI-1.4 Inadequate energy intake As related to eating disorder.  As evidenced by restriction, excessive exercise, and SIV.  Intervention/Goals: Nutrition counseling provided.  Please increase density.  Doing well overall   Monitoring and Evaluation: Patient will follow up in 6 weeks.

## 2017-01-14 ENCOUNTER — Other Ambulatory Visit: Payer: Self-pay | Admitting: Nurse Practitioner

## 2017-01-14 ENCOUNTER — Ambulatory Visit
Admission: RE | Admit: 2017-01-14 | Discharge: 2017-01-14 | Disposition: A | Discharge status: Home / Self Care | Payer: Worker's Compensation | Source: Ambulatory Visit | Attending: Nurse Practitioner | Admitting: Nurse Practitioner

## 2017-01-14 DIAGNOSIS — M25571 Pain in right ankle and joints of right foot: Secondary | ICD-10-CM

## 2017-01-14 DIAGNOSIS — R609 Edema, unspecified: Secondary | ICD-10-CM

## 2017-02-11 ENCOUNTER — Encounter: Payer: 59 | Attending: Family Medicine | Admitting: *Deleted

## 2017-02-11 DIAGNOSIS — F502 Bulimia nervosa: Secondary | ICD-10-CM | POA: Diagnosis not present

## 2017-02-11 DIAGNOSIS — Z713 Dietary counseling and surveillance: Secondary | ICD-10-CM | POA: Insufficient documentation

## 2017-02-11 DIAGNOSIS — F509 Eating disorder, unspecified: Secondary | ICD-10-CM

## 2017-02-11 NOTE — Progress Notes (Signed)
Appointment start time 0930 Appointment end time: 1030  Patient was seen on 02/11/17 for nutrition counseling pertaining to disordered eating  Primary care provider: Paulita Cradle Therapist:  Esperanza Heir Any other medical team members: none   Assessment Is signing up to go to [place] this summer for a couple weeks Started back at school and feels good about going back.  Is able to get school work done while at work Medicine is going well.  Forgot it 2 days in a row.  Had nightmares Is trying to be ok with his weight.  Would like to lose, but is trying to be ok with not Working with Wilfred Lacy on anxiety around exercise.  Is not exercising currently Is not reading or "working" on recovery.  Has set alarms recently to read some.   Had been waiting to pack food the day of so Benjamine Mola has been helping meal prep.  They pack meals and snacks together Thinks working day shift helps   Officially switched to day shift Thinks eating is better as a result Going back to school February 1.   Energy is improved No dizziness No GI distress.  Normal BM No B/P in over 2 weeks.  No binging over the holidays Thinks increased Lexapro is really helping.  Mood is good.  Nothing is looming   Dietary assessment: A typical day consists of 2-3 meals and 4-5 snacks  Safe foods include: eggs, salad items, tofu, rice quinoa mix (in moderation), protein shakes, fruit or vegetable, veggie burgers fear foods include:candy, desserts  24 hour recall Coffee 2 hard boiled eggs noosa Salad with tempeh, avocado, dressing PB bar Mac, quinoa, spinach, soy based protein Ice cream with cookies and coolwhip  Physical activity:none; plans to start today: cardio of elliptical or running and P90X   Estimated energy needs: 2600-2800 kcal 350 g CHO 140 g pro 93 g fat  Nutrition Diagnosis: NI-1.4 Inadequate energy intake As related to eating disorder.  As evidenced by restriction, excessive exercise, and  SIV.  Intervention/Goals: Nutrition counseling provided.  Discussed getting in consistent meals and snacks during the day.  Challenged Derek Sanchez   Monitoring and Evaluation: Patient will follow up in 2 weeks.

## 2017-02-26 ENCOUNTER — Encounter: Payer: 59 | Admitting: *Deleted

## 2017-02-26 DIAGNOSIS — F509 Eating disorder, unspecified: Secondary | ICD-10-CM

## 2017-02-26 DIAGNOSIS — Z713 Dietary counseling and surveillance: Secondary | ICD-10-CM | POA: Diagnosis not present

## 2017-02-26 NOTE — Progress Notes (Signed)
Appointment start time 1400 Appointment end time: 1500  Patient was seen on 02/27/17 for nutrition counseling pertaining to disordered eating  Primary care provider: [name] Therapist:  [name] Any other medical team members: none   Assessment Reading Life without Ed Brought back carbs at breakfast.  Bought croissants and chiabatta bread for sandwiches bought mini croissants and wants to buy larger size Still craving carbs at night so thinks he isn't eating enough.  Wants to try to increase carbs during the day.  Keeps mini croissants In his car, but wants to have aa large croissant in the morning  Exercising some again Sleep has been very poor these past 2 weeks. Trouble staying asleep and also sometimes trouble falling asleep.  Thinks it's nutrition related.  On days he thinks he ate too much, he actually sleeps better  Stopped night snack because he was eating more during the day Plans to  Increase his support system.  Slightly interested in support group and intuitive eating groups     Dietary assessment: A typical day consists of 2-3 meals and 4-5 snacks  Safe foods include: eggs, salad items, tofu, rice quinoa mix (in moderation), protein shakes, fruit or vegetable, veggie burgers fear foods include:candy, desserts  24 hour recall B: 2 eggs, english muffin (strawberry jelly) S: fruit S: pistachios L: mushroom, pea risotto S: 2 clementine, pretzels D: veggie burger with pita chips and salsa   Physical activity:a few times in the past 2 weeks   Estimated energy needs: 2600-2800 kcal 350 g CHO 140 g pro 93 g fat  Nutrition Diagnosis: NI-1.4 Inadequate energy intake As related to eating disorder.  As evidenced by restriction, excessive exercise, and SIV.  Intervention/Goals: Nutrition counseling provided.  Discussed getting in balanced meals and snacks during the day.  TransMontaigneChallenged [name].  Consider groups   Monitoring and Evaluation: Patient will follow up  in 1 weeks.

## 2017-03-04 ENCOUNTER — Ambulatory Visit: Payer: Self-pay | Admitting: *Deleted

## 2017-03-11 ENCOUNTER — Encounter: Payer: 59 | Attending: Family Medicine | Admitting: *Deleted

## 2017-03-11 ENCOUNTER — Encounter: Payer: Self-pay | Admitting: Physician Assistant

## 2017-03-11 DIAGNOSIS — F509 Eating disorder, unspecified: Secondary | ICD-10-CM

## 2017-03-11 DIAGNOSIS — F502 Bulimia nervosa: Secondary | ICD-10-CM | POA: Insufficient documentation

## 2017-03-11 DIAGNOSIS — Z713 Dietary counseling and surveillance: Secondary | ICD-10-CM | POA: Insufficient documentation

## 2017-03-11 NOTE — Progress Notes (Signed)
Appointment start time 1400 Appointment end time: 1500  Patient was seen on 03/11/17 for nutrition counseling pertaining to disordered eating  Primary care provider: Paulita Cradle Therapist:  Esperanza Heir Any other medical team members: none   Assessment Reading Life without Ed Feels like eating is better.  Sleeping better Introduced more carbs into breakfast and is trying to add more fats in during the day Sometimes gets really hungry after dinner and that feels a little scary to him Sometimes restricts at night    Dietary assessment: A typical day consists of 2-3 meals and 4-5 snacks  Safe foods include: eggs, salad items, tofu, rice quinoa mix (in moderation), protein shakes, fruit or vegetable, veggie burgers fear foods include:candy, desserts  24 hour recall B: yogurt siggy's whole milk S: coffee with bagel and cream cheese L: hot dog with sourkraut , pretzel sticks, steamed vegetables S: baby bel cheese and more pretzels Very hungry but waited D: lime tofu tenders, mac-n-cheese, brussel sprouts, broccoli S: lemon bar with cool whip and chocolate chips   Physical activity:a few times in the past 2 weeks   Estimated energy needs: 2600-2800 kcal 350 g CHO 140 g pro 93 g fat  Nutrition Diagnosis: NI-1.4 Inadequate energy intake As related to eating disorder.  As evidenced by restriction, excessive exercise, and SIV.  Intervention/Goals: Nutrition counseling provided.  Discussed getting in balanced meals and snacks during the day.  [employer].  Consider groups Have snack in the car on the way home from work Have dinner with wife each night   Monitoring and Evaluation: Patient will follow up in 1 weeks.

## 2017-03-14 ENCOUNTER — Encounter: Payer: Self-pay | Admitting: Physician Assistant

## 2017-03-14 ENCOUNTER — Ambulatory Visit: Payer: 59 | Admitting: Physician Assistant

## 2017-03-14 ENCOUNTER — Other Ambulatory Visit: Payer: Self-pay

## 2017-03-14 VITALS — BP 102/60 | HR 70 | Temp 97.8°F | Resp 18 | Ht 70.0 in | Wt 218.8 lb

## 2017-03-14 DIAGNOSIS — F509 Eating disorder, unspecified: Secondary | ICD-10-CM | POA: Diagnosis not present

## 2017-03-14 NOTE — Patient Instructions (Addendum)
     IF you received an x-ray today, you will receive an invoice from [facility]. Please contact [facility] at [phone] with questions or concerns regarding your invoice.   IF you received labwork today, you will receive an invoice from [facility]. Please contact [facility] at [phone] with questions or concerns regarding your invoice.   Our billing staff will not be able to assist you with questions regarding bills from these companies.  You will be contacted with the lab results as soon as they are available. The fastest way to get your results is to activate your My Chart account. Instructions are located on the last page of this paperwork. If you have not heard from us regarding the results in 2 weeks, please contact this office.     

## 2017-03-14 NOTE — Progress Notes (Signed)
Derek Sanchez  MRN: [id number] DOB: 11/16/1985  PCP: Mancel Bale, PA-C  Chief Complaint  Patient presents with  . Referral    needs a referral for nutrition and paperwork filled out     Subjective:  Pt presents to clinic for form that needs to be filled out for the [employer].  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     He is working full time as a Editor, commissioning.  He is seeing nutritional counseling.  Rare problems with bulemia but he is stable - last episode was a few weeks.  He feels like the increase in medication has helped. He is now getting his medications from the [place].   History is obtained by patient.  Review of Systems  Constitutional: Negative for appetite change.  Neurological: Negative for dizziness.    Patient Active Problem List   Diagnosis Date Noted  . GERD (gastroesophageal reflux disease) 08/17/2014  . Disordered eating 05/18/2014  . Stress reaction 05/18/2014    Current Outpatient Medications on File Prior to Visit  Medication Sig Dispense Refill  . cetirizine (ZYRTEC) 10 MG tablet Take 10 mg by mouth.    . escitalopram (LEXAPRO) 20 MG tablet Take 1 tablet (20 mg total) by mouth daily. 90 tablet 1  . meloxicam (MOBIC) 7.5 MG tablet Take 1 tablet (7.5 mg total) by mouth daily. 30 tablet 0  . omeprazole (PRILOSEC) 20 MG capsule Take 1 capsule (20 mg total) by mouth daily. 90 capsule 3   No current facility-administered medications on file prior to visit.     No Known Allergies  Past Medical History:  Diagnosis Date  . Allergy   . Anxiety   . 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   Social History   Tobacco Use  . Smoking status: Never Smoker  . Smokeless tobacco: Never Used  Substance Use Topics  . Alcohol use: Yes   Alcohol/week: 1.2 oz    Types: 2 Standard drinks or equivalent per week  . Drug use: No   family history includes Cancer in his other; Diabetes in his mother; Mental illness in his brother.     Objective:  BP 102/60   Pulse 70   Temp 97.8 F (36.6 C) (Oral)   Resp 18   Ht _0  (1.778 m)   Wt 218 lb 12.8 oz (99.2 kg)   SpO2 97%   BMI 31.39 kg/m  Body mass index is 31.39 kg/m.  Physical Exam  Constitutional: He is oriented to person, place, and time and well-developed, well-nourished, and in no distress.  HENT:  Head: Normocephalic and atraumatic.  Right Ear: External ear normal.  Left Ear: External ear normal.  Mouth/Throat: Normal dentition. No dental caries. No posterior oropharyngeal edema or posterior oropharyngeal erythema.  Eyes: Conjunctivae are normal.  Neck: Normal range of motion.  Cardiovascular: Normal rate, regular rhythm and normal heart sounds.  No murmur heard. Pulmonary/Chest: Effort normal and breath sounds normal. He has no wheezes.  Neurological: He is alert and oriented to person, place, and time. Gait normal.  Skin: Skin is warm and dry.  Psychiatric: Mood, memory, affect and judgment normal.    Assessment and Plan :  Disordered eating -  Plan: CMP14+EGFR form filled out - pt is stable in regards to his eating disorder.  He will continue nutrition therapy and [facility].  Windell Hummingbird PA-C  [facility] 03/14/2017 6:06 PM

## 2017-03-15 LAB — CMP14+EGFR
ALT: 31 IU/L (ref 0–44)
AST: 20 IU/L (ref 0–40)
Albumin/Globulin Ratio: 1.5 (ref 1.2–2.2)
Albumin: 4.4 g/dL (ref 3.5–5.5)
Alkaline Phosphatase: 79 IU/L (ref 39–117)
BILIRUBIN TOTAL: 0.4 mg/dL (ref 0.0–1.2)
BUN/Creatinine Ratio: 18 (ref 9–20)
BUN: 12 mg/dL (ref 6–20)
CO2: 28 mmol/L (ref 20–29)
Calcium: 9.3 mg/dL (ref 8.7–10.2)
Chloride: 101 mmol/L (ref 96–106)
Creatinine, Ser: 0.65 mg/dL — ABNORMAL LOW (ref 0.76–1.27)
GFR calc Af Amer: 150 mL/min/{1.73_m2} (ref >59)
GFR calc non Af Amer: 130 mL/min/{1.73_m2} (ref >59)
GLUCOSE: 73 mg/dL (ref 65–99)
Globulin, Total: 3 g/dL (ref 1.5–4.5)
POTASSIUM: 4.3 mmol/L (ref 3.5–5.2)
Sodium: 141 mmol/L (ref 134–144)
Total Protein: 7.4 g/dL (ref 6.0–8.5)

## 2017-03-17 ENCOUNTER — Encounter: Payer: Self-pay | Admitting: Physician Assistant

## 2017-03-17 NOTE — Progress Notes (Signed)
Letter was written

## 2017-03-18 ENCOUNTER — Encounter: Payer: 59 | Admitting: *Deleted

## 2017-03-18 DIAGNOSIS — F509 Eating disorder, unspecified: Secondary | ICD-10-CM

## 2017-03-18 DIAGNOSIS — Z713 Dietary counseling and surveillance: Secondary | ICD-10-CM | POA: Diagnosis not present

## 2017-03-18 NOTE — Progress Notes (Signed)
Appointment start time 1130 Appointment end time: 1230  Patient was seen on 03/18/17 for nutrition counseling pertaining to disordered eating  Primary care provider: [name] Therapist:  [name] Any other medical team members: none   Assessment Tired.  Struggles with gas Not sleeping well due to infant Also not getting enough water   Dietary assessment: A typical day consists of 2-3 meals and 4-5 snacks  Safe foods include: eggs, salad items, tofu, rice quinoa mix (in moderation), protein shakes, fruit or vegetable, veggie burgers fear foods include:candy, desserts  24 hour recall B: 2 hard boiled eggs, chocolate chip muffin S: faye yogurt with honey L: beanie weenies (soy), 1/2 bun S: pretzels, candy D: brown rice, tempeh, mixed veggies S: graham crackers, PB, chocolate pieces Beverages: ice coffee, diet soda, adequate water    Physical activity:very little   Estimated energy needs: 2600-2800 kcal 350 g CHO 140 g pro 93 g fat  Nutrition Diagnosis: NI-1.4 Inadequate energy intake As related to eating disorder.  As evidenced by restriction, excessive exercise, and SIV.  Intervention/Goals: Nutrition counseling provided.  Try kefir and more fluids Explore the gray area in body movement  Monitoring and Evaluation: Patient will follow up in 1 weeks.

## 2017-04-01 ENCOUNTER — Ambulatory Visit: Payer: 59 | Admitting: Physician Assistant

## 2017-04-08 ENCOUNTER — Encounter: Payer: 59 | Attending: Family Medicine | Admitting: *Deleted

## 2017-04-08 DIAGNOSIS — F502 Bulimia nervosa: Secondary | ICD-10-CM | POA: Diagnosis not present

## 2017-04-08 DIAGNOSIS — Z713 Dietary counseling and surveillance: Secondary | ICD-10-CM | POA: Diagnosis not present

## 2017-04-08 DIAGNOSIS — F5002 Anorexia nervosa, binge eating/purging type: Secondary | ICD-10-CM

## 2017-04-08 NOTE — Progress Notes (Signed)
Appointment start time 1045 Appointment end time: 1145  Patient was seen on 04/08/17 for nutrition counseling pertaining to disordered eating  Primary care provider: [name] Therapist:  [name] Any other medical team members: none   Assessment Dog died very suddenly.  Still hurting some around that Sleeping well recently, but last week was more difficult as he was waking up hungry... Increased food intake and is sleeping better.  Wasn't packing meals last week.  Packing now Had really bad headaches twice- nausea, but not vomiting.  Sensitivity to light and sound   Gas is improving   Dietary assessment:  Safe foods include: eggs, salad items, tofu, rice quinoa mix (in moderation), protein shakes, fruit or vegetable, veggie burgers fear foods include:candy, desserts  24 hour recall B: everything bagel with cream cheese.  1/2 banana, clementine S: fage yogurt flip cup L: grilled romaine with tempeh, corn, tomatoes, onion, cheese, honey mustard dressing S: PB crackers, balsamic vinegar triscuit D: ravioli S: ice cream  Physical activity:very little.    Estimated energy needs: 2600-2800 kcal 350 g CHO 140 g pro 93 g fat  Nutrition Diagnosis: NI-1.4 Inadequate energy intake As related to eating disorder.  As evidenced by restriction, excessive exercise, and SIV.  Intervention/Goals: Nutrition counseling provided.  Praised efforts.  Reiterated need for 3 meals and 3 snacks .  Discussed support group options, books, etc discussed body movement for its benefits, but not for weight loss   Monitoring and Evaluation: Patient will follow up in 1 weeks.

## 2017-04-15 ENCOUNTER — Encounter: Payer: 59 | Admitting: *Deleted

## 2017-04-15 DIAGNOSIS — Z713 Dietary counseling and surveillance: Secondary | ICD-10-CM | POA: Diagnosis not present

## 2017-04-15 DIAGNOSIS — F5002 Anorexia nervosa, binge eating/purging type: Secondary | ICD-10-CM

## 2017-04-15 NOTE — Progress Notes (Signed)
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  Yogurt and english muffin Nothing else until D:  Binged/purged  Physical activity:very little.    Estimated energy needs: 2600-2800 kcal 350 g CHO 140 g pro 93 g fat  Nutrition Diagnosis: NI-1.4 Inadequate energy intake As related to eating disorder.  As evidenced by restriction, excessive exercise, and SIV.  Intervention/Goals: Nutrition counseling provided.   Discussed messages around masculinity and how that pertains to mental illness.  Discussed the vulnerability he's experiencing form being malnourished and stressed and how that could affect his eating.  He has the choice.  Don't have to wait until "tomorrow" to get back on track, can restart at any point   Monitoring and Evaluation: Patient will follow up in 2 weeks.

## 2017-04-25 ENCOUNTER — Ambulatory Visit: Payer: 59 | Admitting: Physician Assistant

## 2017-04-30 ENCOUNTER — Ambulatory Visit: Payer: Self-pay | Admitting: *Deleted

## 2017-05-08 ENCOUNTER — Ambulatory Visit: Payer: 59 | Admitting: Physician Assistant

## 2017-05-13 ENCOUNTER — Ambulatory Visit: Payer: Self-pay | Admitting: *Deleted

## 2017-05-14 ENCOUNTER — Ambulatory Visit: Payer: Self-pay | Admitting: *Deleted

## 2017-05-19 ENCOUNTER — Ambulatory Visit: Payer: 59 | Admitting: Physician Assistant

## 2017-05-19 ENCOUNTER — Encounter: Payer: Self-pay | Admitting: Physician Assistant

## 2017-05-19 ENCOUNTER — Other Ambulatory Visit: Payer: Self-pay

## 2017-05-19 VITALS — BP 112/70 | HR 83 | Temp 98.8°F | Resp 18 | Ht 70.0 in | Wt 211.6 lb

## 2017-05-19 DIAGNOSIS — F5002 Anorexia nervosa, binge eating/purging type: Secondary | ICD-10-CM | POA: Diagnosis not present

## 2017-05-19 DIAGNOSIS — F411 Generalized anxiety disorder: Secondary | ICD-10-CM | POA: Diagnosis not present

## 2017-05-19 DIAGNOSIS — F4310 Post-traumatic stress disorder, unspecified: Secondary | ICD-10-CM | POA: Diagnosis not present

## 2017-05-19 MED ORDER — SERTRALINE HCL 50 MG PO TABS: 50.0000 mg | ORAL_TABLET | Freq: Every day | ORAL | 0 refills | Status: DC

## 2017-05-19 NOTE — Patient Instructions (Addendum)
Week 1- zoloft  Week 2/3 - Zoloft  Week 4 - if tolerating Zoloft  we will increase to Zoloft   Vyvanse is the medication about binge eating    IF you received an x-ray today, you will receive an invoice from [facility]. Please contact [facility] at [phone] with questions or concerns regarding your invoice.   IF you received labwork today, you will receive an invoice from [facility]. Please contact [facility] at [phone] with questions or concerns regarding your invoice.   Our billing staff will not be able to assist you with questions regarding bills from these companies.  You will be contacted with the lab results as soon as they are available. The fastest way to get your results is to activate your My Chart account. Instructions are located on the last page of this paperwork. If you have not heard from [place] regarding the results in 2 weeks, please contact this office.

## 2017-05-19 NOTE — Progress Notes (Signed)
Derek Sanchez  MRN: [id number] DOB: Aug 05, 1985  PCP: Morrell Riddle, PA-C  Chief Complaint  Patient presents with  . Medication Refill    wants to discuss other options for lexapro   . Depression    screening was an 8     Subjective:  Pt presents to clinic for medication discussion.  He stopped the lexapro about 1-2 months ago.  He stopped because he thought he was more of a zombie and less passionate on the lexapro.  He felt like he was just going through the motions while on that medication and he stopped working out or really caring about anything .    He knows he needs to be on medication now that he stopped the medication but he does not know what other options there are for him because he does not want to return to Lexapro.  He has been researching other medications including SNRIs and he is interested in information regarding those.    His disordered eating has been activated since stopping Lexapro.- binging and purging again - 3-4 times a weeks - most of the time just once a day but when he first stopped the Lexapro he was purging more likely twice a day most days of the week.  He has been diagnosed with PTSD through the [place].  He did note that prior to starting the Lexapro and now since stopping the Lexapro he was having many more physical symptoms of his anxiety.  While on Lexapro his physical symptoms of his anxiety poor controlled.  See laura for nutrition and Genelle Bal for therapy.  History is obtained by patient.  Review of Systems  Constitutional: Negative for chills and fever.  Psychiatric/Behavioral: Negative for sleep disturbance. The patient is nervous/anxious.     Patient Active Problem List   Diagnosis Date Noted  . GERD (gastroesophageal reflux disease) 08/17/2014  . Disordered eating 05/18/2014  . Stress reaction 05/18/2014    Current Outpatient Medications on File Prior to Visit  Medication Sig Dispense Refill  . cetirizine (ZYRTEC) 10 MG tablet Take 10 mg by  mouth.    . meloxicam (MOBIC) 7.5 MG tablet Take 1 tablet (7.5 mg total) by mouth daily. 30 tablet 0  . omeprazole (PRILOSEC) 20 MG capsule Take 1 capsule (20 mg total) by mouth daily. 90 capsule 3   No current facility-administered medications on file prior to visit.     No Known Allergies  Past Medical History:  Diagnosis Date  . Allergy   . Anxiety   . 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   Social History   Tobacco Use  . Smoking status: Never Smoker  . Smokeless tobacco: Never Used  Substance Use Topics  . Alcohol use: Yes    Alcohol/week: 1.2 oz    Types: 2 Standard drinks or equivalent per week  . Drug use: No   family history includes Cancer in his other; Diabetes in his mother; Mental illness in his brother.     Objective:  BP 112/70   Pulse 83   Temp 98.8 F (37.1 C) (Oral)   Resp 18   Ht  (1.778 m)   Wt 211 lb 9.6 oz (96 kg)   SpO2 97%   BMI 30.36 kg/m  Body mass index is 30.36 kg/m.  Physical Exam  Constitutional: He is  oriented to person, place, and time. He appears well-developed and well-nourished.  HENT:  Head: Normocephalic and atraumatic.  Right Ear: External ear normal.  Left Ear: External ear normal.  Eyes: Conjunctivae are normal.  Neck: Normal range of motion.  Cardiovascular: Normal rate, regular rhythm and normal heart sounds.  No murmur heard. Pulmonary/Chest: Effort normal and breath sounds normal. He has no wheezes.  Neurological: He is alert and oriented to person, place, and time.  Skin: Skin is warm and dry.  Psychiatric: His behavior is normal. Judgment and thought content normal.  Slightly flat affect  Vitals reviewed.   Assessment and Plan :  GAD (generalized anxiety disorder) - Plan: sertraline (ZOLOFT) 50 MG tablet -patient was well controlled with his anxiety on Lexapro but was having side effects  that he no longer wanted to deal with.  We will restart another SSR, Zoloft, at this time.  We will titrate up rather quickly to get control of his symptoms.  He will continue nutritional therapy as well as mental health therapy.  Anorexia nervosa, binge-eating purging type -currently not controlled since going off of Lexapro.  He will continue nutritional therapy.  We did discuss maybe the Vyvanse being on medication for him but I must research this further.  PTSD (post-traumatic stress disorder) -diagnosed at the [place].  Symptoms were controlled on Lexapro but since got going off having much more physical symptoms hopefully these will resolve with the addition of Zoloft.  Patient will contact me in a month via my chart for possible increase in dose and he will see me in 8 weeks.  Benny Lennert PA-C  Primary [facility] at [facility] 05/19/2017 3:10 PM

## 2017-05-21 ENCOUNTER — Ambulatory Visit: Payer: Self-pay | Admitting: *Deleted

## 2017-05-21 ENCOUNTER — Encounter

## 2017-05-27 ENCOUNTER — Encounter: Payer: 59 | Attending: Family Medicine | Admitting: *Deleted

## 2017-05-27 DIAGNOSIS — F5002 Anorexia nervosa, binge eating/purging type: Secondary | ICD-10-CM

## 2017-05-27 DIAGNOSIS — Z713 Dietary counseling and surveillance: Secondary | ICD-10-CM | POA: Insufficient documentation

## 2017-05-27 DIAGNOSIS — F502 Bulimia nervosa: Secondary | ICD-10-CM | POA: Insufficient documentation

## 2017-05-27 NOTE — Progress Notes (Signed)
Appointment start time 0930 Appointment end time: 1030  Patient was seen on 05/27/17 for nutrition counseling pertaining to disordered eating  Primary care provider: Harlin Heys Therapist:  [name] Any other medical team members: none   Assessment Going to buy new clothes Was gonna skip breakfast, but stopped on the way in for breakfast Doesn't like to eat at night due to acid reflux and he isn't consistent with his omeprazole. No SIV since last visit at [facility].  Getting hunger cue  Slightly concerned about upcoming family coming  Listening to Life Without Ed.  Paused reading Man Up Increased to 50 mg Zoloft  Dietary assessment:  Safe foods include: eggs, salad items, tofu, rice quinoa mix (in moderation), protein shakes, fruit or vegetable, veggie burgers fear foods include:candy, desserts  24 hour recall B: 3 eggs, morningstar sausage S: cuties L: ciabatta sandwich with avocado and tofu meat and tortilla chips D: grilled chicken with sweet potato and asparagus S: ice cream  Physical activity:very little.    Estimated energy needs: 2600-2800 kcal 350 g CHO 140 g pro 93 g fat  Nutrition Diagnosis: NI-1.4 Inadequate energy intake As related to eating disorder.  As evidenced by restriction, excessive exercise, and SIV.  Intervention/Goals: Nutrition counseling provided.   Challenged eating disorder.  Discussed bravery and eating enough   Monitoring and Evaluation: Patient will follow up in 1 weeks.

## 2017-06-03 ENCOUNTER — Encounter: Payer: 59 | Admitting: *Deleted

## 2017-06-03 DIAGNOSIS — F5002 Anorexia nervosa, binge eating/purging type: Secondary | ICD-10-CM

## 2017-06-03 DIAGNOSIS — K219 Gastro-esophageal reflux disease without esophagitis: Secondary | ICD-10-CM

## 2017-06-03 DIAGNOSIS — Z713 Dietary counseling and surveillance: Secondary | ICD-10-CM | POA: Diagnosis not present

## 2017-06-03 NOTE — Progress Notes (Signed)
Appointment start time 1000 Appointment end time: 1100  Patient was seen on 06/03/17 for nutrition counseling pertaining to disordered eating  Primary care provider: Harlin Heys Therapist:  [name] Any other medical team members: none   Assessment Reading Man Up and liking it Was daughter's birthday and was slightly hectic.  Tried to eat what he wanted when he wanted.  One time he felt uncomfortably full and purged, but then found out everyone felt sick the next day after eating that  Still on 50 mg Zoloft.  Anxiety much improved Sleeping better   Dietary assessment:  Safe foods include: eggs, salad items, tofu, rice quinoa mix (in moderation), protein shakes, fruit or vegetable, veggie burgers fear foods include:candy, desserts  24 hour recall B: bagel with cream cheese Bowl of ice cream L: chipotle- vegetarian bowl with guac and chips S: veggie straws D: chicken, rice, green beans S: tortilla chips and 1/2 bundtini    Physical activity:very little.    Estimated energy needs: 2600-2800 kcal 350 g CHO 140 g pro 93 g fat  Nutrition Diagnosis: NI-1.4 Inadequate energy intake As related to eating disorder.  As evidenced by restriction, excessive exercise, and SIV.  Intervention/Goals: Nutrition counseling provided.   Challenged eating disorder.   He can eat appropriately when he tunes out [employer].  Discussed sitting with discomfort and not using behaviors  Monitoring and Evaluation: Patient will follow up in 1 weeks.

## 2017-06-09 ENCOUNTER — Encounter: Payer: Self-pay | Admitting: Physician Assistant

## 2017-06-09 DIAGNOSIS — F411 Generalized anxiety disorder: Secondary | ICD-10-CM

## 2017-06-10 MED ORDER — SERTRALINE HCL 100 MG PO TABS: 100.0000 mg | ORAL_TABLET | Freq: Every day | ORAL | 0 refills | Status: AC

## 2017-06-11 ENCOUNTER — Encounter: Payer: 59 | Attending: Family Medicine | Admitting: *Deleted

## 2017-06-11 DIAGNOSIS — F502 Bulimia nervosa: Secondary | ICD-10-CM | POA: Insufficient documentation

## 2017-06-11 DIAGNOSIS — Z713 Dietary counseling and surveillance: Secondary | ICD-10-CM | POA: Insufficient documentation

## 2017-06-11 DIAGNOSIS — F5002 Anorexia nervosa, binge eating/purging type: Secondary | ICD-10-CM

## 2017-06-11 NOTE — Progress Notes (Signed)
Appointment start time 1600 Appointment end time: 1700  Patient was seen on 06/11/17 for nutrition counseling pertaining to disordered eating  Primary care provider: [name] Therapist:  [name] Any other medical team members: none   Assessment Still reading Man Up and Listening to Life Without Ed Trying to eat more throughout the day so is't as hungry at night and doesn't wake up with as much guilt Is up to 100 mg Zoloft and thinks it's working well.  Takes it at night.  No grogginess No binging.  SIV twice while wife was out of town    Dietary assessment:  Safe foods include: eggs, salad items, tofu, rice quinoa mix (in moderation), protein shakes, fruit or vegetable, veggie burgers fear foods include:candy, desserts  24 hour recall B: 2 blueberry muffins with kodiak cake mix L: salad with tofu chicken and vegetables with ranch dressing and crutons L: buffalo chickpea burger on ciabatta D: havarti cheese on chickpea burger on ciabatta with veggie straws   Physical activity:very little.    Estimated energy needs: 2600-2800 kcal 350 g CHO 140 g pro 93 g fat  Nutrition Diagnosis: NI-1.4 Inadequate energy intake As related to eating disorder.  As evidenced by restriction, excessive exercise, and SIV.  Intervention/Goals: Nutrition counseling provided.   Challenged eating disorder.   Discussed sitting with discomfort and not using behaviors  Monitoring and Evaluation: Patient will follow up in 1 weeks.

## 2017-06-15 ENCOUNTER — Other Ambulatory Visit: Payer: Self-pay | Admitting: Physician Assistant

## 2017-06-15 DIAGNOSIS — F411 Generalized anxiety disorder: Secondary | ICD-10-CM

## 2017-06-17 ENCOUNTER — Encounter: Payer: 59 | Admitting: *Deleted

## 2017-06-17 DIAGNOSIS — Z713 Dietary counseling and surveillance: Secondary | ICD-10-CM | POA: Diagnosis not present

## 2017-06-17 DIAGNOSIS — F5002 Anorexia nervosa, binge eating/purging type: Secondary | ICD-10-CM

## 2017-06-17 NOTE — Progress Notes (Signed)
Appointment start time 1000 Appointment end time: 1100  Patient was seen on 06/17/17 for nutrition counseling pertaining to disordered eating  Primary care provider: Paulita Cradle Therapist:  Esperanza Heir Any other medical team members: none   Assessment Has been working more and that can affect his mental and physical health Listened to story about eating pizza and  Eating behaviors were kinda on a pendulum- restricted during the day, but got situated in the evening Had to weigh in for the [place], but was anxious about the fitness test and weighing in.   Still reading Man Up and likes it    Dietary assessment:  Safe foods include: eggs, salad items, tofu, rice quinoa mix (in moderation), protein shakes, fruit or vegetable, veggie burgers fear foods include:candy, desserts  24 hour recall B: everything bagel with cream cheese and coffee S: chex mix L: [place] sandwich with cup of noodles and green beans S: snyders pretzel D: chicken tacos with avocado and mac-n-cheese and brussel sprouts S: ice cream   Physical activity:very little.    Estimated energy needs: 2600-2800 kcal 350 g CHO 140 g pro 93 g fat  Nutrition Diagnosis: NI-1.4 Inadequate energy intake As related to eating disorder.  As evidenced by restriction, excessive exercise, and SIV.  Intervention/Goals: Nutrition counseling provided.   Discussed self-care and how prioritizing self- care can be so important.  Consider listening to podcasts while at work in the car  Monitoring and Evaluation: Patient will follow up in 1 weeks.

## 2017-06-24 ENCOUNTER — Encounter: Payer: 59 | Attending: Physician Assistant | Admitting: *Deleted

## 2017-06-24 DIAGNOSIS — F5002 Anorexia nervosa, binge eating/purging type: Secondary | ICD-10-CM

## 2017-06-24 DIAGNOSIS — F509 Eating disorder, unspecified: Secondary | ICD-10-CM | POA: Diagnosis not present

## 2017-06-24 NOTE — Progress Notes (Signed)
Appointment start time 1000 Appointment end time: 1100  Patient was seen on 06/24/17 for nutrition counseling pertaining to disordered eating  Primary care provider: [name] Therapist:  [name] Any other medical team members: none   Assessment thinking about eating more meat, but worried about animal rights Sleeping better. New bed coming soon Eating has been better this week.  Has packed his lunch every day! He feels good about it   No B/P !!    Dietary assessment:  Safe foods include: eggs, salad items, tofu, rice quinoa mix (in moderation), protein shakes, fruit or vegetable, veggie burgers fear foods include:candy, desserts  24 hour recall B: everything bagel with cream cheese, some banana S: pretzel thins L: leftover grilled chicken on ciabatta bun with lettuce, tomato and BBQ sauce and pepperjack cheese.  avocado oil cooked chips S: nature valley protein bar D: mccallister's sandwich S: ice cream   Physical activity:very little.    Estimated energy needs: 2600-2800 kcal 350 g CHO 140 g pro 93 g fat  Nutrition Diagnosis: NI-1.4 Inadequate energy intake As related to eating disorder.  As evidenced by restriction, excessive exercise, and SIV.  Intervention/Goals: Nutrition counseling provided.   Great job!!  Try to listen to podcasts/purge social media  Monitoring and Evaluation: Patient will follow up in 1 weeks.

## 2017-07-01 ENCOUNTER — Ambulatory Visit: Payer: Self-pay | Admitting: *Deleted

## 2017-07-16 ENCOUNTER — Encounter: Payer: 59 | Attending: Family Medicine | Admitting: *Deleted

## 2017-07-16 DIAGNOSIS — F502 Bulimia nervosa: Secondary | ICD-10-CM | POA: Diagnosis not present

## 2017-07-16 DIAGNOSIS — F5002 Anorexia nervosa, binge eating/purging type: Secondary | ICD-10-CM

## 2017-07-16 DIAGNOSIS — Z713 Dietary counseling and surveillance: Secondary | ICD-10-CM | POA: Diagnosis not present

## 2017-07-16 NOTE — Progress Notes (Signed)
Appointment start time 1600 Appointment end time: 1700  Patient was seen on 07/16/17 for nutrition counseling pertaining to disordered eating  Primary care provider: [name] Therapist:  [name] Any other medical team members: none   Assessment Had terrible case of strep throat and missed less appointment  Got promoted in the [employer] and can buy uniforms that fit   Last week was restricting and binge eating at night.  This week has been more consistent    Dietary assessment:  Safe foods include: eggs, salad items, tofu, rice quinoa mix (in moderation), protein shakes, fruit or vegetable, veggie burgers fear foods include:candy, desserts  24 hour recall B: english muffin with 2 hard boiled eggs, watermelon S: snyder's honey mustard pretzels L: little cesear's pizza D: chicken asian dish   Physical activity:very little.    Estimated energy needs: 2600-2800 kcal 350 g CHO 140 g pro 93 g fat  Nutrition Diagnosis: NI-1.4 Inadequate energy intake As related to eating disorder.  As evidenced by restriction, excessive exercise, and SIV.  Intervention/Goals: Nutrition counseling provided.  Challenged thin ideal/toxic masculinity.  discussed how dieting cycle does not work Try to listen to podcasts/read at work  Monitoring and Evaluation: Patient will follow up in 2 weeks.

## 2017-07-22 ENCOUNTER — Ambulatory Visit: Payer: 59 | Admitting: Physician Assistant

## 2017-07-22 ENCOUNTER — Ambulatory Visit: Payer: Self-pay | Admitting: *Deleted

## 2017-07-29 ENCOUNTER — Ambulatory Visit: Payer: Self-pay | Admitting: *Deleted

## 2017-07-31 ENCOUNTER — Ambulatory Visit: Payer: 59 | Admitting: Physician Assistant

## 2017-08-05 ENCOUNTER — Encounter: Payer: 59 | Admitting: *Deleted

## 2017-08-05 DIAGNOSIS — Z713 Dietary counseling and surveillance: Secondary | ICD-10-CM | POA: Diagnosis not present

## 2017-08-05 DIAGNOSIS — F5002 Anorexia nervosa, binge eating/purging type: Secondary | ICD-10-CM

## 2017-08-05 NOTE — Progress Notes (Signed)
Appointment start time 0730 Appointment end time: 0830  Patient was seen on 08/05/17 for nutrition counseling pertaining to disordered eating  Primary care provider: [name] Therapist:  [name] Any other medical team members: none   Assessment Bought uniforms that fit and that feels better Listening to [employer]-  Drinking more water. Eating is better on work days- gets starbucks bagel, then beef jerky for snack, lunch is sub sandwich or chipotle  Sleeping somewhat better, but waking up in the middle of the night and going downstairs to sleep on the couch.  Maybe is hungry?  Will try Melatonin again    Dietary assessment:  Safe foods include: eggs, salad items, tofu, rice quinoa mix (in moderation), protein shakes, fruit or vegetable, veggie burgers fear foods include:candy, desserts  24 hour recall B: french toast. Strawberry bar S: apple with PB L: salad with egg, avocado, crutons S: pita chips D:  Chicken in crockpot in burrito S: frozen yogurt Beverages: water, carbonated water, beer   Physical activity:very little.    Estimated energy needs: 2600-2800 kcal 350 g CHO 140 g pro 93 g fat  Nutrition Diagnosis: NI-1.4 Inadequate energy intake As related to eating disorder.  As evidenced by restriction, excessive exercise, and SIV.  Intervention/Goals: Nutrition counseling provided.  Challenged ED voice about being worthless because of weight gain.  Used promotion as proof he has value.  Encouraged him to write down anti Felicity Pellegrinied messages Discussed briefly middle ground with exercise.  Reiterated [name]   Monitoring and Evaluation: Patient will follow up in 1 weeks.

## 2017-08-12 ENCOUNTER — Ambulatory Visit: Payer: Self-pay | Admitting: *Deleted

## 2017-08-20 ENCOUNTER — Encounter: Payer: 59 | Attending: Family Medicine | Admitting: *Deleted

## 2017-08-20 DIAGNOSIS — F502 Bulimia nervosa: Secondary | ICD-10-CM | POA: Insufficient documentation

## 2017-08-20 DIAGNOSIS — Z713 Dietary counseling and surveillance: Secondary | ICD-10-CM | POA: Diagnosis not present

## 2017-08-20 DIAGNOSIS — F5002 Anorexia nervosa, binge eating/purging type: Secondary | ICD-10-CM

## 2017-08-20 NOTE — Progress Notes (Signed)
Appointment start time 1500 Appointment end time: 1600  Patient was seen on 08/20/17 for nutrition counseling pertaining to disordered eating  Primary care provider: Paulita Cradle Therapist:  Esperanza Heir Any other medical team members: none   Assessment Looking to move to [place].  Will register for [employer] body trust group  Thinks eating is much better.  Some days are better than others Learned some coping mechanisms that work sometimes, but the [employer] aren't supportive Will be finished with the hazing process soon  Sleeping better in general, but sometimes wakes up   Dietary assessment:  Safe foods include: eggs, salad items, tofu, rice quinoa mix (in moderation), protein shakes, fruit or vegetable, veggie burgers fear foods include:candy, desserts  24 hour recall B: 2 hard boiled eggs and banana.   Today was just eggs S: pretzels  Today was chicken wrap L: beef jerky, trail mix          Today was green beans, mac-n-cheese, bbq chicken D: chipotle- with steak and beans S: teddy grahams  Physical activity:very little.    Estimated energy needs: 2600-2800 kcal 350 g CHO 140 g pro 93 g fat  Nutrition Diagnosis: NI-1.4 Inadequate energy intake As related to eating disorder.  As evidenced by restriction, excessive exercise, and SIV.  Intervention/Goals: Nutrition counseling provided.   Discussed eating meals that he would serve a guest in his own home.    Monitoring and Evaluation: Patient will follow up in 1 weeks.

## 2017-08-26 ENCOUNTER — Ambulatory Visit: Payer: Self-pay | Admitting: *Deleted

## 2017-09-02 ENCOUNTER — Encounter: Payer: 59 | Admitting: *Deleted

## 2017-09-02 DIAGNOSIS — F5002 Anorexia nervosa, binge eating/purging type: Secondary | ICD-10-CM

## 2017-09-02 DIAGNOSIS — Z713 Dietary counseling and surveillance: Secondary | ICD-10-CM | POA: Diagnosis not present

## 2017-09-02 NOTE — Progress Notes (Signed)
Appointment start time 1100 Appointment end time: 1200  Patient was seen on 09/02/17 for nutrition counseling pertaining to disordered eating  Primary care provider: [name] Therapist:  [name] Any other medical team members: none   Assessment Injured his hand with the [employer] and isn't allowed to exercise.  Struggling with should he stay in the [employer] or not [place] Body Trust group starts end of september Really struggling with diarrhea.  Was prescribed unknown steroid for nerve pain  Feeling more depressed lately.  Could be the [employer] hazing process   Dietary assessment:  Safe foods include: eggs, salad items, tofu, rice quinoa mix (in moderation), protein shakes, fruit or vegetable, veggie burgers fear foods include:candy, desserts  24 hour recall B: bagel with cream cheese and hard boiled egg S: snyder's pretzels L: 12 in sub Malawiturkey form sheetz S: tortilla chips with salsa. Grapes D: pizza spaghetti squash S: oatmeal   Physical activity:vnone  Estimated energy needs: 2600-2800 kcal 350 g CHO 140 g pro 93 g fat  Nutrition Diagnosis: NI-1.4 Inadequate energy intake As related to eating disorder.  As evidenced by restriction, excessive exercise, and SIV.  Intervention/Goals: Nutrition counseling provided.   Discussed eating enough during the day and challenging the urge to SIV Recommended again other resources like books and podcasts and social media  Please drink gatorade/pedialyte for dehydration risk and call your medical provider if diarrhea doesn't improve  Monitoring and Evaluation: Patient will follow up in 1 weeks.

## 2017-09-09 ENCOUNTER — Ambulatory Visit: Payer: Self-pay | Admitting: *Deleted

## 2017-09-23 ENCOUNTER — Encounter: Payer: 59 | Attending: Family Medicine | Admitting: *Deleted

## 2017-09-23 DIAGNOSIS — F5002 Anorexia nervosa, binge eating/purging type: Secondary | ICD-10-CM

## 2017-09-23 DIAGNOSIS — Z713 Dietary counseling and surveillance: Secondary | ICD-10-CM | POA: Diagnosis not present

## 2017-09-23 DIAGNOSIS — F502 Bulimia nervosa: Secondary | ICD-10-CM | POA: Insufficient documentation

## 2017-09-23 NOTE — Progress Notes (Signed)
Appointment start time 0730 Appointment end time: 0830  Patient was seen on 09/23/17 for nutrition counseling pertaining to disordered eating  Primary care provider: [name] Therapist:  [name] Any other medical team members: none   Assessment Kern Albertaaron Flores group starts [employer] hazing is over, states he is going to work on recovery.  Reading, listening to podcasts Would like to also exercise: 30 minutes a few times a week  Was made to eat 3 MRE in a day on Friday and he is still full Didn't get hardly any sleep that whole week.  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    Had headaches during training, but not anymore Sleeping better now Stomachache still since MRE on Friday.  Constipated  Less B/P probably because due to [employer] stuff- too busy .  Can no longer restrict like he used to   Dietary assessment:  Safe foods include: eggs, salad items, tofu, rice quinoa mix (in moderation), protein shakes, fruit or vegetable, veggie burgers fear foods include:candy, desserts  24 hour recall Brunch: large Malaw[place]urkey and ham sandwich with slaw, potato salad, cape cod chips S: tortilla chips with guac D: ravioli with walnuts, apple and chicken slices S: graham crackers with bananas foster Beverages: la croix, water, beer  Physical activity:none  Estimated energy needs: 2600-2800 kcal 350 g CHO 140 g pro 93 g fat  Nutrition Diagnosis: NI-1.4 Inadequate energy intake As related to eating disorder.  As evidenced by restriction, excessive exercise, and SIV.  Intervention/Goals: Nutrition counseling provided.   Discussed HAES and BMI.  Suggested reading Body Respect. Discussed feeding his brain with books and podcasts and body diverse images  Monitoring and Evaluation: Patient will follow up in 1 weeks.

## 2017-10-01 ENCOUNTER — Encounter: Payer: 59 | Admitting: *Deleted

## 2017-10-01 DIAGNOSIS — F5002 Anorexia nervosa, binge eating/purging type: Secondary | ICD-10-CM

## 2017-10-01 DIAGNOSIS — Z713 Dietary counseling and surveillance: Secondary | ICD-10-CM | POA: Diagnosis not present

## 2017-10-01 NOTE — Progress Notes (Signed)
Appointment start time 1500 Appointment end time: 1600  Patient was seen on 10/01/17 for nutrition counseling pertaining to disordered eating  Primary care provider: Harlin Heys Therapist:  [name] Any other medical team members: none   Assessment Kern Alberta group started and has had 2 sessions.  Really enjoying it Is working on more self care Has an absess that isn't healing.  Diet mentality vs non diet mentality worksheet has been really helpful to validate his experience  Ate well at the beach.  Some fat shaming at work and so he bought new pants  Pretty gassy.  Taking probiotics and trying to do kefir   Sleeping better in general    Dietary assessment:  Safe foods include: eggs, salad items, tofu, rice quinoa mix (in moderation), protein shakes, fruit or vegetable, veggie burgers fear foods include:candy, desserts  24 hour recall B: everything bagel with cream cheese and tomato and butter and jelly L: hamburger with fries S: luna bar D: felt guilty Later ate tortilla chips with beans and cheese with guac and salsa  Physical activity:none  Estimated energy needs: 2600-2800 kcal 350 g CHO 140 g pro 93 g fat  Nutrition Diagnosis: NI-1.4 Inadequate energy intake As related to eating disorder.  As evidenced by restriction, excessive exercise, and SIV.  Intervention/Goals: Nutrition counseling provided.   Discussed HAES and BMI.  Suggested reading Body Respect. Discussed feeding his brain with books and podcasts and body diverse images  Monitoring and Evaluation: Patient will follow up in 1 weeks.

## 2017-10-07 ENCOUNTER — Ambulatory Visit: Payer: Self-pay | Admitting: *Deleted

## 2017-10-08 ENCOUNTER — Ambulatory Visit: Payer: Self-pay | Admitting: *Deleted

## 2017-10-14 ENCOUNTER — Encounter: Payer: 59 | Attending: Family Medicine | Admitting: *Deleted

## 2017-10-14 DIAGNOSIS — F502 Bulimia nervosa: Secondary | ICD-10-CM | POA: Insufficient documentation

## 2017-10-14 DIAGNOSIS — Z713 Dietary counseling and surveillance: Secondary | ICD-10-CM | POA: Diagnosis not present

## 2017-10-14 DIAGNOSIS — F50029 Anorexia nervosa, binge eating/purging type, unspecified: Secondary | ICD-10-CM

## 2017-10-14 DIAGNOSIS — F5002 Anorexia nervosa, binge eating/purging type: Secondary | ICD-10-CM

## 2017-10-14 NOTE — Progress Notes (Signed)
Appointment start time 1130 Appointment end time: 1230  Patient was seen on 10/14/17 for nutrition counseling pertaining to disordered eating  Primary care provider: Harlin Heys Therapist:  [name] Any other medical team members: none   Assessment Still doing men's group and that continues to be helpful.  Is opening up more around the men in the group Has not seen therapist yet  Sleeping well, poor energy GI distress.  Heartburn often Less SIV.  Only 1-2 in 2 weeks.  Fighting those urges  Wanting to be more active, but afraid it'll be triggering    Dietary assessment:  Safe foods include: eggs, salad items, tofu, rice quinoa mix (in moderation), protein shakes, fruit or vegetable, veggie burgers fear foods include:candy, desserts  24 hour recall B: bagel with cream cheese from starbucks S: quest bar L: tuna sandwich, chips S: jerky D: meatball spaghetti with veggie noodles S: sorbet S: pb and J  Physical activity:none  Estimated energy needs: 2600-2800 kcal 350 g CHO 140 g pro 93 g fat  Nutrition Diagnosis: NI-1.4 Inadequate energy intake As related to eating disorder.  As evidenced by restriction, excessive exercise, and SIV.  Intervention/Goals: Nutrition counseling provided.   Watched [employer] in session.  Discussed against HAES and BMI I suspect his depression is getting the best of things judging by his fatigue.  Suggested talking with his therapist about best next steps.  Please listen to podcasts  Monitoring and Evaluation: Patient will follow up in 1 weeks.

## 2017-10-21 ENCOUNTER — Encounter: Payer: 59 | Admitting: *Deleted

## 2017-10-21 DIAGNOSIS — Z713 Dietary counseling and surveillance: Secondary | ICD-10-CM | POA: Diagnosis not present

## 2017-10-21 DIAGNOSIS — F5002 Anorexia nervosa, binge eating/purging type: Secondary | ICD-10-CM

## 2017-10-21 NOTE — Progress Notes (Signed)
Appointment start time 1130 Appointment end time: 1215  Patient was seen on 10/21/17 for nutrition counseling pertaining to disordered eating  Primary care provider: Harlin Heys Therapist:  [name] Any other medical team members: none   Assessment Went to [place] over the weekend- inlaws are dieting.  Wasn't triggering and he was able to recognize how poorly they are takign care of themselves Listening to Dietitians Unplugged.  Walked while listening and really enjoyed it.  Set appropriate limits Learned body movement can be enjoyable and not punishing and not excessive       Dietary assessment:  Safe foods include: eggs, salad items, tofu, rice quinoa mix (in moderation), protein shakes, fruit or vegetable, veggie burgers fear foods include:candy, desserts  24 hour recall Ran out of time  Physical activity:none  Estimated energy needs: 2600-2800 kcal 350 g CHO 140 g pro 93 g fat  Nutrition Diagnosis: NI-1.4 Inadequate energy intake As related to eating disorder.  As evidenced by restriction, excessive exercise, and SIV.  Intervention/Goals: Nutrition counseling provided.   Praised progress.  Praised Civil engineer, contracting.  Discussed metabolic effects of restriction   Monitoring and Evaluation: Patient will follow up in 6 weeks when I return and may see Mirian Capuchin in the interim

## 2017-11-25 ENCOUNTER — Encounter: Payer: 59 | Attending: Family Medicine | Admitting: *Deleted

## 2017-11-25 DIAGNOSIS — F502 Bulimia nervosa: Secondary | ICD-10-CM | POA: Insufficient documentation

## 2017-11-25 DIAGNOSIS — F5002 Anorexia nervosa, binge eating/purging type: Secondary | ICD-10-CM

## 2017-11-25 DIAGNOSIS — Z713 Dietary counseling and surveillance: Secondary | ICD-10-CM | POA: Diagnosis present

## 2017-11-25 NOTE — Progress Notes (Signed)
Appointment start time 0830 Appointment end time: 0930  Patient was seen on 11/25/17 for nutrition counseling pertaining to disordered eating  Primary care provider: [name] Therapist:  Mathis DadBrett Sanchez Any other medical team members: none   Assessment Seeing Derek BalBrett consistently.  Has an appt with gastro.  Very gassy and feels bloated.  Family hix of lactose intolerance Trying to pay attention to what foods make symptoms worse, but isn't able to find one  Has had headaches for 4 days.  Is congested also.  Wife was sick Has been listening to Dietitian's Unplugged, finished [facility] group. Normalized his feelings about his struggles/desensitized him to those fears.  comfort with his body is growing (slowly) SIV less than 1/week.  Thinks he has a better grasp of hunger and fullness cues.  He's learning more about his body and working with it and not against it.    Liking podcasts. Listening while driving  Still eats when he feels boated/uncomfortable  Sometimes forgets anxiety medication. Feeling less anxious overall Feels sleepy      Dietary assessment:  Safe foods include: eggs, salad items, tofu, rice quinoa mix (in moderation), protein shakes, fruit or vegetable, veggie burgers fear foods include:candy, desserts  24 hour recall B: pancakes  S: celery with PB L: tuna salad wrap with pretzels S: nachos D: ravioli S: tuna sandwich  Physical activity:nothing structured, walks some  Estimated energy needs: 2600-2800 kcal 350 g CHO 140 g pro 93 g fat  Nutrition Diagnosis: NI-1.4 Inadequate energy intake As related to eating disorder.  As evidenced by restriction, excessive exercise, and SIV.  Intervention/Goals: Nutrition counseling provided.   Praised progress.  Praised Civil engineer, contractingprorecovery choices. Like reading, podcasts     Monitoring and Evaluation: Patient will follow up in 2 weeks

## 2017-12-09 ENCOUNTER — Ambulatory Visit: Payer: Self-pay | Admitting: *Deleted

## 2017-12-10 ENCOUNTER — Encounter: Payer: 59 | Attending: Family Medicine | Admitting: *Deleted

## 2017-12-10 DIAGNOSIS — F502 Bulimia nervosa: Secondary | ICD-10-CM | POA: Diagnosis not present

## 2017-12-10 DIAGNOSIS — K219 Gastro-esophageal reflux disease without esophagitis: Secondary | ICD-10-CM

## 2017-12-10 DIAGNOSIS — Z713 Dietary counseling and surveillance: Secondary | ICD-10-CM | POA: Diagnosis present

## 2017-12-10 DIAGNOSIS — F5002 Anorexia nervosa, binge eating/purging type: Secondary | ICD-10-CM

## 2017-12-10 NOTE — Progress Notes (Signed)
Appointment start time 0800 Appointment end time: 0900  Patient was seen on 12/10/17 for nutrition counseling pertaining to disordered eating  Primary care provider: [name] Therapist:  [name] Any other medical team members: none   Assessment Has been sick and was tachycardic temporarily. He was dehydrated Has been feeling better Felt better enough to go [place] moving there Thanksgiving was fine and he ate appropriately.   Has been packing his lunch for work and that is going well. Really enjoying ethnic foods, especially Asian foods Trying to eat at "meal times"; tries to eat in the morning even if he wasn't hungry and actually feels better Saw gastroenterologist who ruled out several things and encouraged him to rule out dairy or try IBGuard which was helpful  SIV twice.  Had really bad reflux.  In general things are better. Takes ompeprazole daily.  Normal BM     Dietary assessment:  Safe foods include: eggs, salad items, tofu, rice quinoa mix (in moderation), protein shakes, fruit or vegetable, veggie burgers fear foods include:candy, desserts  24 hour recall B: bacon and egg biscuit S: Nabs L: ramen with veggies S: Pretzels D: stew S: popcorn  Physical activity:nothing structured, walks with his family.  Wants to be more active so might start cycling  Estimated energy needs: 2600-2800 kcal 350 g CHO 140 g pro 93 g fat  Nutrition Diagnosis: NI-1.4 Inadequate energy intake As related to eating disorder.  As evidenced by restriction, excessive exercise, and SIV.  Intervention/Goals: Nutrition counseling provided.   Praised progress.  Praised Civil engineer, contractingprorecovery choices. 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   Discussed how reducing SIV will improve reflux   Monitoring and Evaluation: Patient will follow up in 1 weeks

## 2017-12-11 ENCOUNTER — Telehealth: Payer: Self-pay | Admitting: Family Medicine

## 2017-12-11 NOTE — Telephone Encounter (Signed)
Patient needs to touch base with Derek LennertSarah Weber to get a new referral

## 2017-12-11 NOTE — Telephone Encounter (Signed)
-----   Message from Myles LippsIrma M Santiago, MD sent at 12/10/2017  8:07 PM EST ----- He is seeing Benny LennertSarah Weber over at [facility]. He will need her to renew referral Thanks  ----- Message ----- From: Nelma Rothmanay, Sherron Sent: 12/10/2017   3:01 PM EST To: Doristine BosworthZoe A Stallings, MD, Myles LippsIrma M Santiago, MD  Hello,   Can we please update this patients referral?  He was seen today and they need the referral again as soon as possibl for the nutrition NDM  Thank you

## 2017-12-11 NOTE — Telephone Encounter (Signed)
-----   Message from Irma M Santiago, MD sent at 12/10/2017  8:07 PM EST ----- °He is seeing [name] over at [facility]. He will need her to renew referral °Thanks ° °----- Message ----- °From: Ray, Sherron °Sent: 12/10/2017   3:01 PM EST °To: Zoe A Stallings, MD, Irma M Santiago, MD ° °Hello,  ° °Can we please update this patients referral?  He was seen today and they need the referral again as soon as possibl for the nutrition NDM ° °Thank you  ° °

## 2017-12-16 ENCOUNTER — Encounter: Payer: 59 | Admitting: *Deleted

## 2017-12-16 DIAGNOSIS — F5002 Anorexia nervosa, binge eating/purging type: Secondary | ICD-10-CM

## 2017-12-16 DIAGNOSIS — Z713 Dietary counseling and surveillance: Secondary | ICD-10-CM | POA: Diagnosis not present

## 2017-12-16 NOTE — Progress Notes (Signed)
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  Is trying to discern is dairy makes things worse.  Takes IBguard, but forgot about lactaid tablet. Less reflex.  Taking omeprazole, not needed zantac     Dietary assessment:  Safe foods include: eggs, salad items, tofu, rice quinoa mix (in moderation), protein shakes, fruit or vegetable, veggie burgers fear foods include:candy, desserts  24 hour recall B: bacon egg and cheese on biscuit S: Nabs L: frozen vegetable budda bowl S: pretzels and banana D: tacos (beans, cheese, guac) with chips S: ice cream  Physical activity:nothing structured, walks with his family.  Wants to be more active so might start cycling  Estimated energy needs: 2600-2800 kcal 350 g CHO 140 g pro 93 g fat  Nutrition Diagnosis: NI-1.4 Inadequate energy intake As related to eating disorder.  As evidenced by restriction, excessive exercise, and SIV.  Intervention/Goals: Nutrition counseling provided.   Praised progress.  Praised Civil engineer, contractingprorecovery choices. Like reading, podcasts   Keep that momentum going.  Try Food [employer]   Monitoring and Evaluation: Patient will follow up in 1 weeks

## 2017-12-16 NOTE — Telephone Encounter (Signed)
[name] with [facility] calling to follow up on referral.  [name] given Derek RomansSara Weber new location for referral.

## 2017-12-22 ENCOUNTER — Other Ambulatory Visit: Payer: Self-pay | Admitting: Family Medicine

## 2017-12-22 DIAGNOSIS — F5002 Anorexia nervosa, binge eating/purging type: Secondary | ICD-10-CM

## 2017-12-23 ENCOUNTER — Encounter: Payer: 59 | Admitting: *Deleted

## 2017-12-23 DIAGNOSIS — Z713 Dietary counseling and surveillance: Secondary | ICD-10-CM | POA: Diagnosis not present

## 2017-12-23 DIAGNOSIS — F5002 Anorexia nervosa, binge eating/purging type: Secondary | ICD-10-CM

## 2017-12-23 NOTE — Progress Notes (Signed)
Appointment start time 1100 Appointment end time: 1200  Patient was seen on 12/23/17 for nutrition counseling pertaining to disordered eating  Primary care provider: [name] Therapist:  [name] Any other medical team members: none   Assessment   Final nutrition visit before this provider changes jobs Saw PCP who prescribed medication for headaches.  Has been taking ibuprofen.  Hasn't started prescription yet.    Things are going well.  Skipped 2 meals yesterday instead of SIV.  Derek Sanchez was talking but [name] was able to challenge that and ate in the end      Dietary assessment:  Safe foods include: eggs, salad items, tofu, rice quinoa mix (in moderation), protein shakes, fruit or vegetable, veggie burgers fear foods include:candy, desserts  24 hour recall B" eggs, tofu bacon S: trail mix L: blt with avocado, c[place] chips S: christmas cookies D: pad thai S: celery and pb  Physical activity:nothing structured, walks with his family.  Wants to be more active so might start cycling  Estimated energy needs: 2600-2800 kcal 350 g CHO 140 g pro 93 g fat  Nutrition Diagnosis: NI-1.4 Inadequate energy intake As related to eating disorder.  As evidenced by restriction, excessive exercise, and SIV.  Intervention/Goals: Nutrition counseling provided.   Recommended contacting [name] for nutrition counseling Keep up reading, podcasts, etc. Proud of the hard work!   Monitoring and Evaluation: Patient will follow up with another provider

## 2018-07-16 IMAGING — CR DG ANKLE COMPLETE 3+V*R*
3 series · 3 of 3 positions shown · non-contrast
Comparison: None.

CLINICAL DATA: Right ankle pain and swelling after twisting injury
jumping a fence. Question fracture.

EXAM:
RIGHT ANKLE - COMPLETE 3+ VIEW

[x ankle ap right]
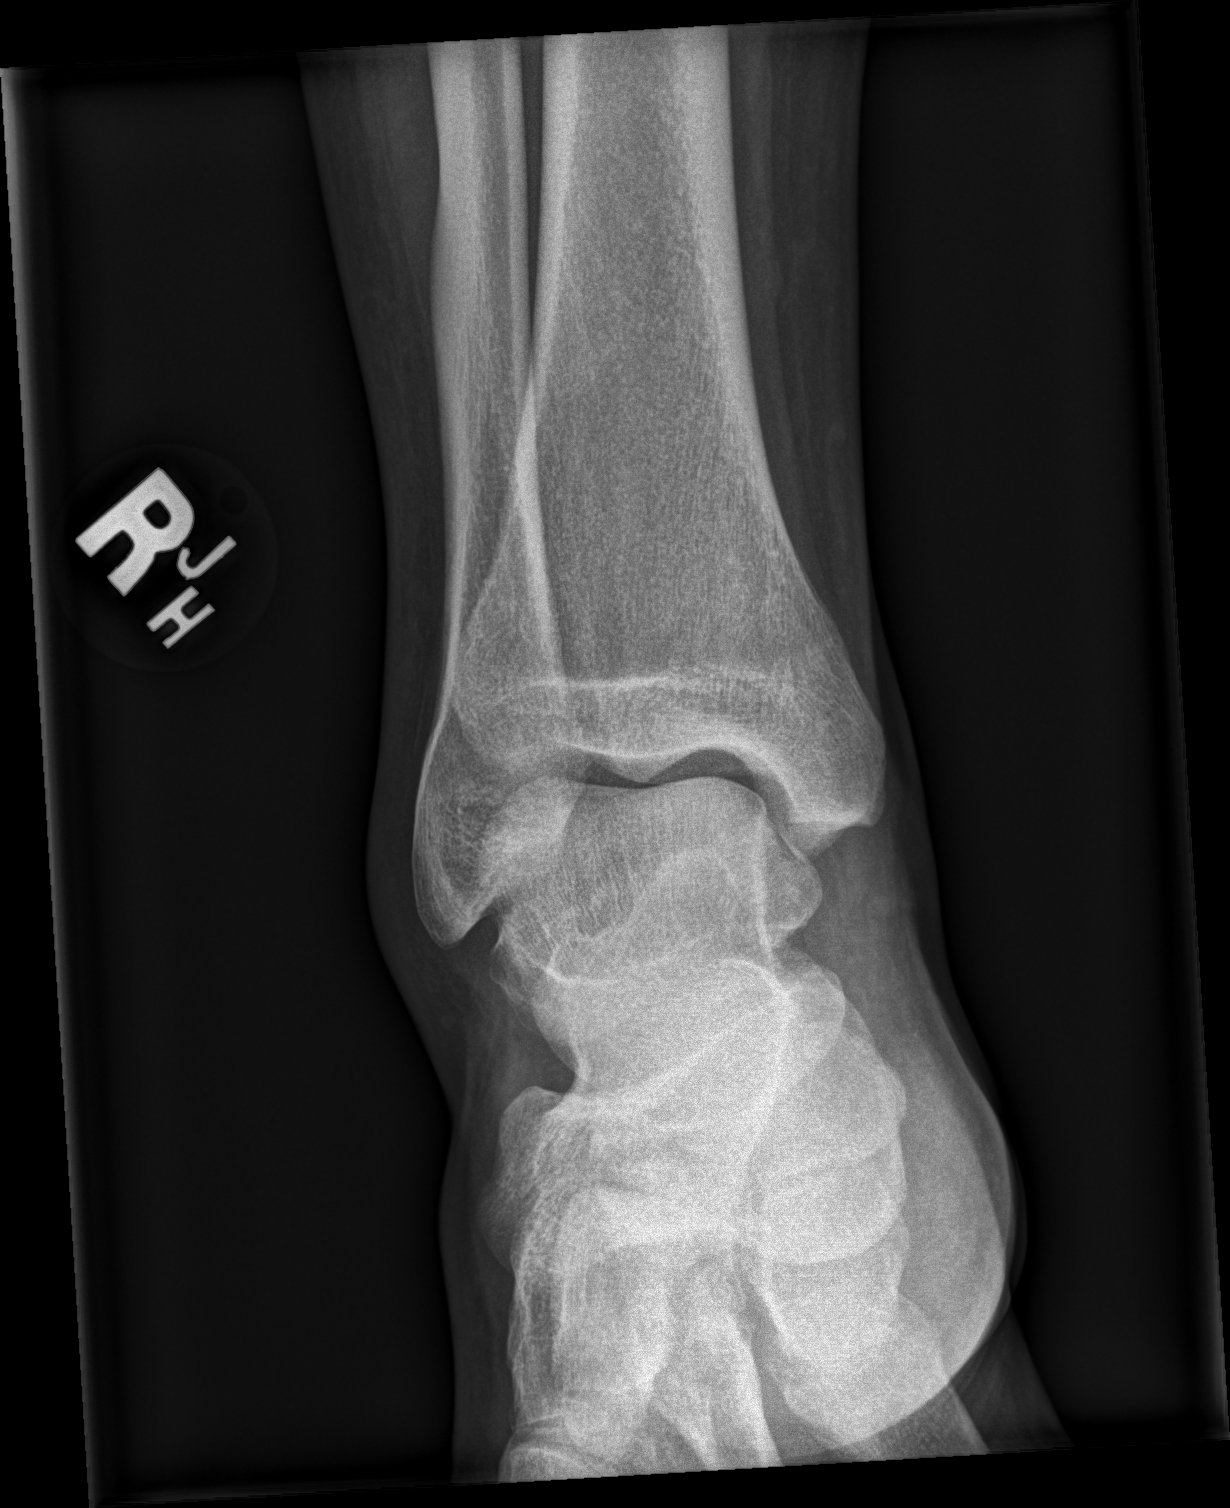

[x ankle obl right]
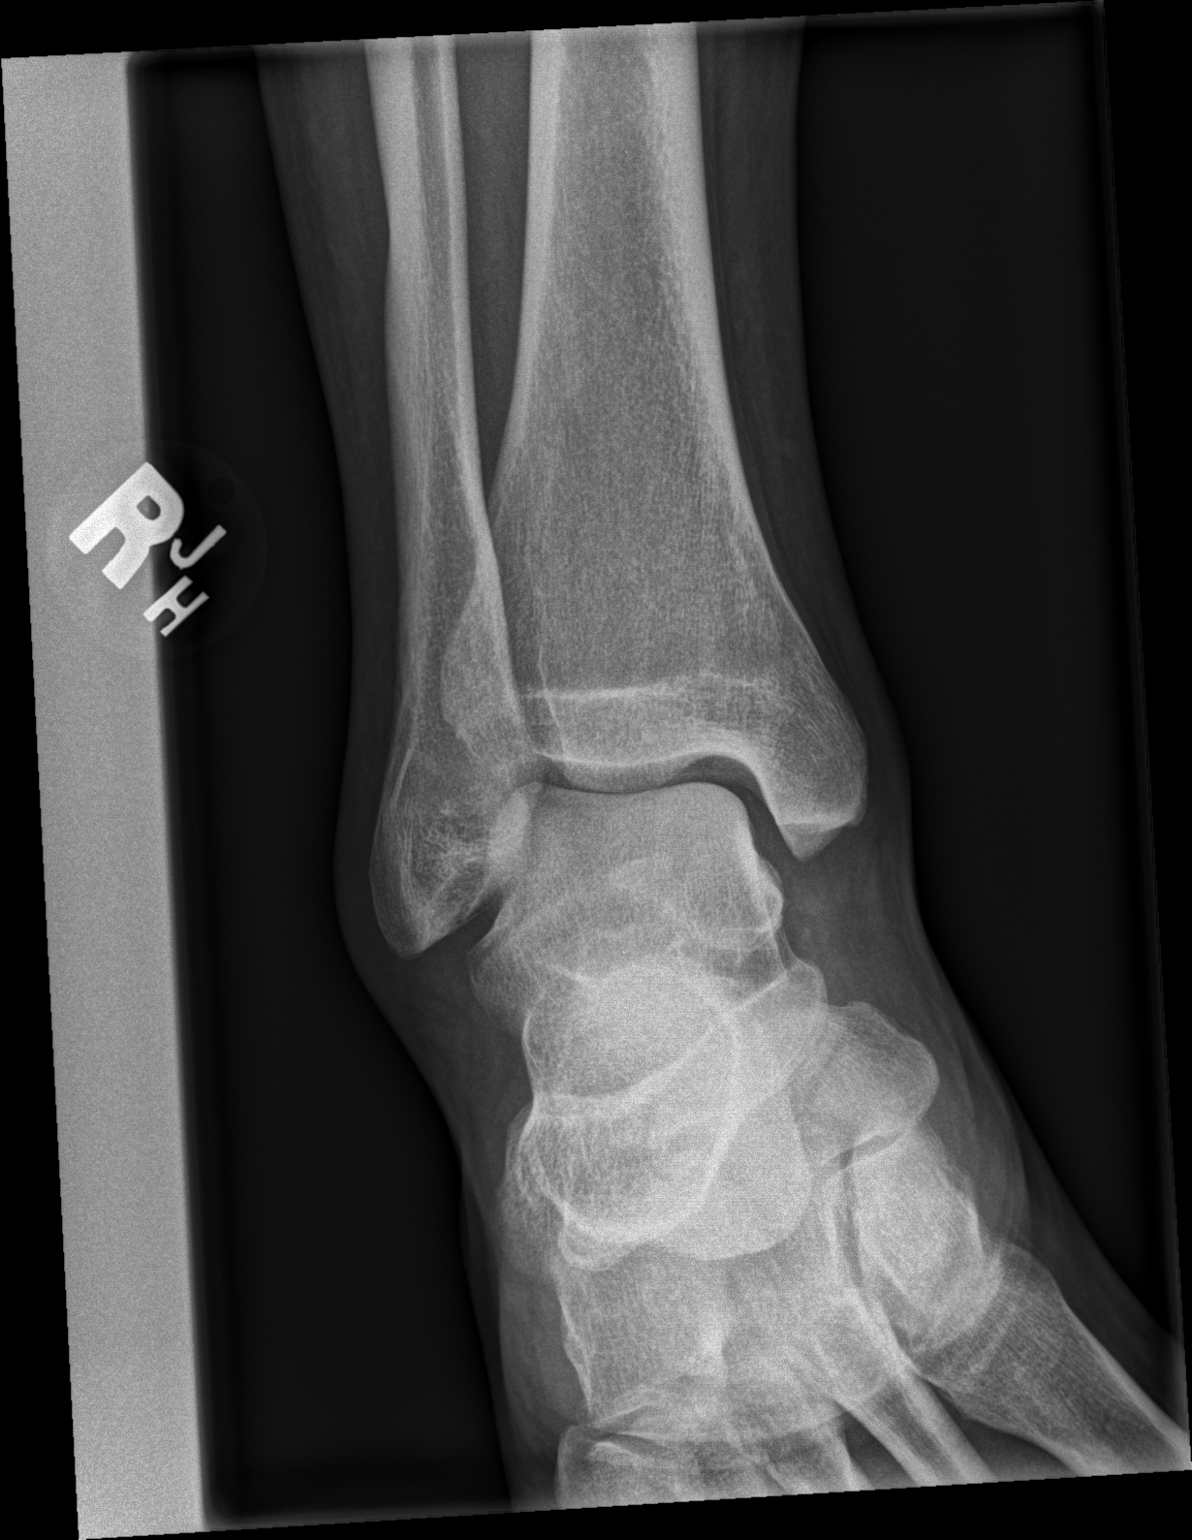

[x ankle lat right]
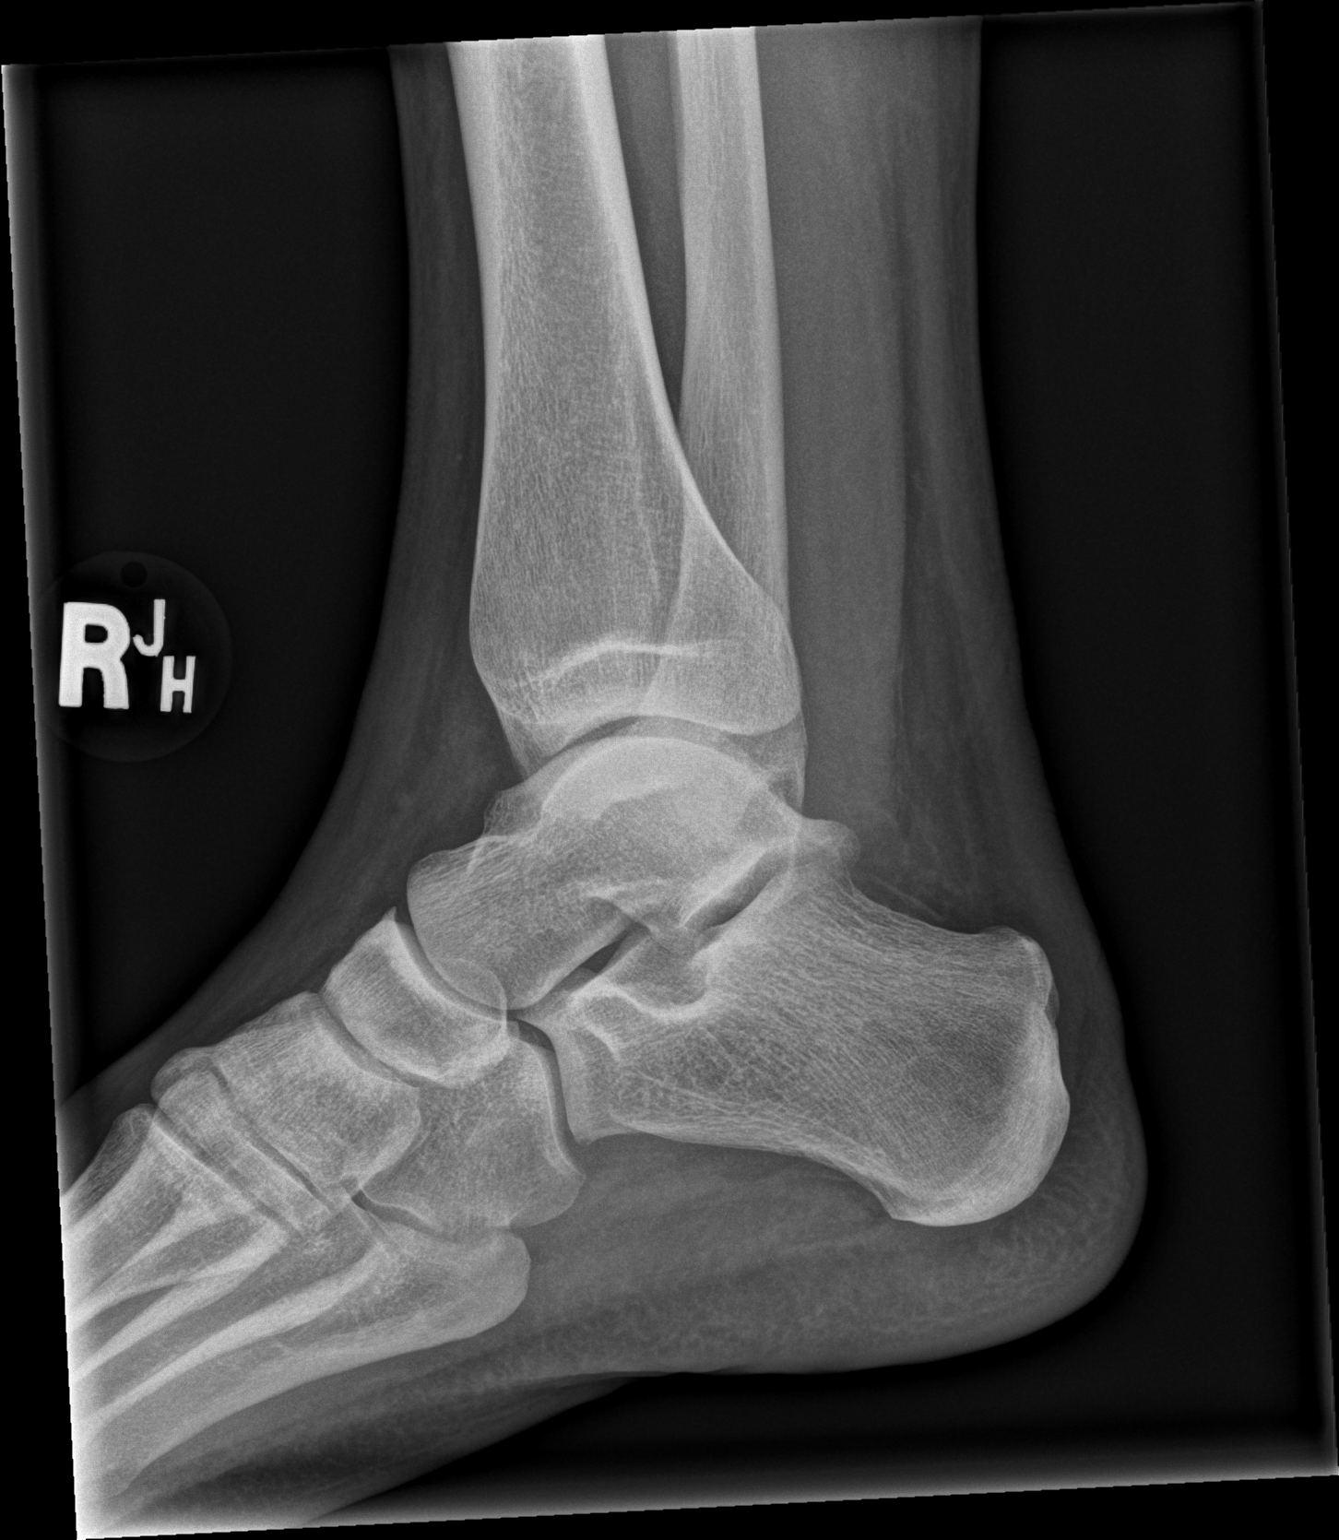

[3 of 3 positions shown; findings below may reference images not displayed]

FINDINGS: There is no evidence of fracture, dislocation, or joint effusion.
There is no evidence of arthropathy or other focal bone abnormality.
Mild soft tissue edema most prominent laterally.
IMPRESSION: Soft tissue edema.  No fracture or dislocation.
